# Patient Record
Sex: Female | Born: 1940 | Race: White | Hispanic: No | Marital: Married | Smoking: Former smoker
Health system: Southern US, Community
[De-identification: ages and names within clinical notes are randomized; demographics above are authoritative.]

## PROBLEM LIST (undated history)

## (undated) DIAGNOSIS — Z923 Personal history of irradiation: Secondary | ICD-10-CM

## (undated) DIAGNOSIS — R011 Cardiac murmur, unspecified: Secondary | ICD-10-CM

## (undated) DIAGNOSIS — C3490 Malignant neoplasm of unspecified part of unspecified bronchus or lung: Secondary | ICD-10-CM

## (undated) DIAGNOSIS — C779 Secondary and unspecified malignant neoplasm of lymph node, unspecified: Secondary | ICD-10-CM

## (undated) DIAGNOSIS — J439 Emphysema, unspecified: Secondary | ICD-10-CM

## (undated) HISTORY — DX: Emphysema, unspecified: J43.9

## (undated) HISTORY — PX: PARTIAL HYSTERECTOMY: SHX80

## (undated) HISTORY — DX: Secondary and unspecified malignant neoplasm of lymph node, unspecified: C77.9

## (undated) HISTORY — PX: LYMPHADENECTOMY: SHX15

## (undated) HISTORY — PX: COLONOSCOPY: SHX174

## (undated) HISTORY — DX: Cardiac murmur, unspecified: R01.1

---

## 2002-06-08 DIAGNOSIS — C779 Secondary and unspecified malignant neoplasm of lymph node, unspecified: Secondary | ICD-10-CM

## 2002-06-08 HISTORY — DX: Secondary and unspecified malignant neoplasm of lymph node, unspecified: C77.9

## 2002-08-28 ENCOUNTER — Ambulatory Visit: Admission: RE | Admit: 2002-08-28 | Discharge: 2002-11-26 | Payer: Self-pay | Admitting: *Deleted

## 2008-10-24 ENCOUNTER — Ambulatory Visit (HOSPITAL_COMMUNITY): Admission: RE | Admit: 2008-10-24 | Discharge: 2008-10-24 | Payer: Self-pay | Admitting: Ophthalmology

## 2008-12-06 ENCOUNTER — Ambulatory Visit (HOSPITAL_COMMUNITY): Admission: RE | Admit: 2008-12-06 | Discharge: 2008-12-06 | Payer: Self-pay | Admitting: Ophthalmology

## 2010-09-16 LAB — BASIC METABOLIC PANEL
BUN: 11 mg/dL (ref 6–23)
CO2: 32 mEq/L (ref 19–32)
Chloride: 102 mEq/L (ref 96–112)
Glucose, Bld: 99 mg/dL (ref 70–99)
Potassium: 3.8 mEq/L (ref 3.5–5.1)

## 2013-01-30 ENCOUNTER — Telehealth: Payer: Self-pay | Admitting: Pulmonary Disease

## 2013-01-30 NOTE — Telephone Encounter (Signed)
I spoke with pt daughter. She scheduled appt with MW tomorrow at 12 pm. Aware to arrive at 11:45 and to bring medication bottles at OV. She was giving address. Nothing further needed

## 2013-01-30 NOTE — Telephone Encounter (Signed)
Received call from Dr. Sherryll Burger.  Pt noted to have lung mass on CT chest, and needs pulmonary evaluation.  Will route to triage to call pt to schedule pulmonary consult visit this week >> can be with any provider.  Pt's phone number is [phone].

## 2013-01-31 ENCOUNTER — Encounter: Payer: Self-pay | Admitting: Internal Medicine

## 2013-01-31 ENCOUNTER — Ambulatory Visit (INDEPENDENT_AMBULATORY_CARE_PROVIDER_SITE_OTHER): Payer: Medicare Other | Admitting: Internal Medicine

## 2013-01-31 ENCOUNTER — Institutional Professional Consult (permissible substitution): Payer: Self-pay | Admitting: Internal Medicine

## 2013-01-31 VITALS — BP 100/70 | HR 99 | Temp 98.2°F | Ht 65.0 in | Wt 83.2 lb

## 2013-01-31 DIAGNOSIS — R918 Other nonspecific abnormal finding of lung field: Secondary | ICD-10-CM | POA: Insufficient documentation

## 2013-01-31 DIAGNOSIS — J449 Chronic obstructive pulmonary disease, unspecified: Secondary | ICD-10-CM

## 2013-01-31 DIAGNOSIS — J9610 Chronic respiratory failure, unspecified whether with hypoxia or hypercapnia: Secondary | ICD-10-CM

## 2013-01-31 DIAGNOSIS — R222 Localized swelling, mass and lump, trunk: Secondary | ICD-10-CM

## 2013-01-31 MED ORDER — PREDNISONE (PAK) 10 MG PO TABS: ORAL_TABLET | ORAL | Status: DC

## 2013-01-31 NOTE — Assessment & Plan Note (Signed)
On 02 since 2009 just at hs but Feb 2014 changed to 24/7 02 @ 2 lpm  Adequate control on present rx, reviewed > no change in rx needed, should tolerate fob ok but low threshold to admit post fob for expedient rx

## 2013-01-31 NOTE — Patient Instructions (Addendum)
Stop anaprox  Take hydrocodone  1-2  every 4 hours for pain anywhere or cough  Ok to use the nebulizer up to every 4 h  Prednisone 10 mg take  4 each am x 2 days,   2 each am x 2 days,  1 each am x 2 days and stop   Try prilosec 20mg   Take 30-60 min before first meal of the day and Pepcid 20 mg one bedtime for now  Outpatient registration at [facility]  at noon Thursday the 28th  for bronchoscopy with biopsy and ok to have liquids for breakfast

## 2013-01-31 NOTE — Assessment & Plan Note (Signed)
Moderately severe clinically so rec continue nebs and spiriva plus Prednisone 10 mg take  4 each am x 2 days,   2 each am x 2 days,  1 each am x 2 days and stop

## 2013-01-31 NOTE — Assessment & Plan Note (Signed)
Prev dx of ca ? 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

## 2013-01-31 NOTE — Progress Notes (Signed)
  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 Recurrent  Or new Lung ca   01/31/2013 1st [facility] office visit/ Meredith Alexander cc overall gradually downhill since Feb 2014 initially felt to have pna but never really acutely ill with more of an indolent onset, progressively worse esp x 3 weeks with poor appetite and coughing traces of blood on day of ov on naprosyn. Still able to swallow liquids ok. Hurting all over taking about 3 vicodin a day. Sob with bed to chair not much better with saba/ spiriva and even pred and currently on levaquin  No obvious daytime variabilty or assoc pleurtic cp or chest tightness, subjective wheeze overt sinus or hb symptoms. No unusual exp hx or h/o childhood pna/ asthma or knowledge of premature birth.    Also denies any obvious fluctuation of symptoms with weather or environmental changes or other aggravating or alleviating factors except as outlined above    Review of Systems  Constitutional: Positive for unexpected weight change. Negative for fever and chills.  HENT: Positive for trouble swallowing. Negative for ear pain, nosebleeds, congestion, sore throat, rhinorrhea, sneezing, dental problem, voice change, postnasal drip and sinus pressure.   Eyes: Negative for visual disturbance.  Respiratory: Positive for cough and shortness of breath. Negative for choking.   Cardiovascular: Positive for chest pain. Negative for leg swelling.  Gastrointestinal: Negative for vomiting, abdominal pain and diarrhea.  Genitourinary: Negative for difficulty urinating.  Musculoskeletal: Negative for arthralgias.  Skin: Negative for rash.  Neurological: Negative for tremors, syncope and  headaches.  Hematological: Does not bruise/bleed easily.       Objective:   Physical Exam  Chronically ill elderly wf > stated age, w/c bound on 02  HEENT mild turbinate edema.  Oropharynx no thrush or excess pnd or cobblestoning.  No JVD or cervical adenopathy. Mild accessory muscle hypertrophy. Trachea midline, nl thryroid. Chest was hyperinflated by percussion with diminished breath sounds and moderate increased exp time with sonorous rhonchi bilaterally L > R  Hoover sign positive at mid inspiration. Regular rate and rhythm without murmur gallop or rub or increase P2 or edema.  Abd: no hsm, nl excursion. Ext warm without cyanosis or clubbing.     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

## 2013-02-01 ENCOUNTER — Encounter (HOSPITAL_COMMUNITY): Payer: Self-pay

## 2013-02-02 ENCOUNTER — Ambulatory Visit (HOSPITAL_COMMUNITY)
Admission: RE | Admit: 2013-02-02 | Discharge: 2013-02-02 | Disposition: A | Discharge status: Home / Self Care | Payer: Medicare Other | Source: Ambulatory Visit | Attending: Internal Medicine | Admitting: Internal Medicine

## 2013-02-02 ENCOUNTER — Encounter (HOSPITAL_COMMUNITY)
Admission: RE | Disposition: A | Discharge status: Home / Self Care | Payer: Self-pay | Source: Ambulatory Visit | Attending: Internal Medicine

## 2013-02-02 ENCOUNTER — Telehealth: Payer: Self-pay | Admitting: Internal Medicine

## 2013-02-02 ENCOUNTER — Encounter (HOSPITAL_COMMUNITY): Payer: Self-pay | Admitting: Radiology

## 2013-02-02 DIAGNOSIS — R918 Other nonspecific abnormal finding of lung field: Secondary | ICD-10-CM

## 2013-02-02 DIAGNOSIS — C3430 Malignant neoplasm of lower lobe, unspecified bronchus or lung: Secondary | ICD-10-CM | POA: Insufficient documentation

## 2013-02-02 DIAGNOSIS — Z87891 Personal history of nicotine dependence: Secondary | ICD-10-CM | POA: Insufficient documentation

## 2013-02-02 DIAGNOSIS — R222 Localized swelling, mass and lump, trunk: Secondary | ICD-10-CM

## 2013-02-02 HISTORY — PX: VIDEO BRONCHOSCOPY: SHX5072

## 2013-02-02 SURGERY — VIDEO BRONCHOSCOPY WITHOUT FLUORO
Anesthesia: Moderate Sedation | Laterality: Bilateral

## 2013-02-02 MED ORDER — PHENYLEPHRINE HCL 0.25 % NA SOLN: 1.0000 | Freq: Four times a day (QID) | NASAL | Status: DC | PRN

## 2013-02-02 MED ORDER — MEPERIDINE HCL 100 MG/ML IJ SOLN
INTRAMUSCULAR | Status: AC
  Filled 2013-02-02: qty 2

## 2013-02-02 MED ORDER — LIDOCAINE HCL 2 % EX GEL: Freq: Once | CUTANEOUS | Status: DC

## 2013-02-02 MED ORDER — LIDOCAINE HCL 2 % EX GEL
CUTANEOUS | Status: DC | PRN
  Administered 2013-02-02: 1

## 2013-02-02 MED ORDER — MIDAZOLAM HCL 10 MG/2ML IJ SOLN
INTRAMUSCULAR | Status: AC
  Filled 2013-02-02: qty 4

## 2013-02-02 MED ORDER — MEPERIDINE HCL 25 MG/ML IJ SOLN
INTRAMUSCULAR | Status: DC | PRN
  Administered 2013-02-02: 25 mg via INTRAVENOUS

## 2013-02-02 MED ORDER — LIDOCAINE HCL 1 % IJ SOLN
INTRAMUSCULAR | Status: DC | PRN
  Administered 2013-02-02: 6 mL via RESPIRATORY_TRACT

## 2013-02-02 MED ORDER — EPINEPHRINE HCL 0.1 MG/ML IJ SOSY
PREFILLED_SYRINGE | INTRAMUSCULAR | Status: DC | PRN
  Administered 2013-02-02: 1 mg via ENDOTRACHEOPULMONARY

## 2013-02-02 MED ORDER — ALBUTEROL SULFATE (5 MG/ML) 0.5% IN NEBU
2.5000 mg | INHALATION_SOLUTION | Freq: Once | RESPIRATORY_TRACT | Status: AC
  Administered 2013-02-02: 2.5 mg via RESPIRATORY_TRACT

## 2013-02-02 MED ORDER — OXYCODONE HCL 5 MG PO TABS: ORAL_TABLET | ORAL | Status: DC

## 2013-02-02 MED ORDER — PHENYLEPHRINE HCL 0.25 % NA SOLN
NASAL | Status: DC | PRN
  Administered 2013-02-02: 2 via NASAL

## 2013-02-02 MED ORDER — MIDAZOLAM HCL 10 MG/2ML IJ SOLN
INTRAMUSCULAR | Status: DC | PRN
  Administered 2013-02-02: 2.5 mg via INTRAVENOUS

## 2013-02-02 NOTE — Op Note (Signed)
Bronchoscopy Procedure Note  Date of Operation:  02/02/2013   Pre-op Diagnosis: lung mass  Post-op Diagnosis: lung ca   Surgeon: [name]  Anesthesia: Monitored Local Anesthesia with Sedation  Operation: Video Flexible fiberoptic bronchoscopy, diagnostic   Findings: Endobronchial obstruction with friable mass at level of proximal bronchus intermedius  Specimen: Endobronchial bx RLL  Estimated Blood Loss: min, controlled with epi  Complications: none  Indications and History: See updated H and P same date. The risks, benefits, complications, treatment options and expected outcomes were discussed with the patient.  The possibilities of reaction to medication, pulmonary aspiration, perforation of a viscus, bleeding, failure to diagnose a condition and creating a complication requiring transfusion or operation were discussed with the patient who freely signed the consent.    Description of Procedure: The patient was re-examined in the bronchoscopy suite and the site of surgery properly noted/marked.  The patient was identified  and the procedure verified as Flexible Fiberoptic Bronchoscopy.  A Time Out was held and the above information confirmed.   After the induction of topical nasopharyngeal anesthesia, the patient was positioned  and the bronchoscope was passed through the R naris. The vocal cords were visualized and  1% buffered lidocaine 5 ml was topically placed onto the cords. The cords were  Nl.  The scope was then passed into the trachea.  1% buffered lidocaine given topically. Airways inspected bilaterally to the subsegmental level with the following findings:  Carina and L sided airways are nl R bronchus intermedius with friable lumpy bumpy mass completely obstructing /distoring distal airwasy  Interventions: Epi x one Endobronchial bx x 4 with adequate hemostasis  The Patient was taken to the Endoscopy Recovery area in satisfactory condition.  Attestation: I  performed the procedure.  [name], MD Pulmonary and Critical Care Medicine [facility] Cell [phone] After 5:30 PM or weekends, call [phone]

## 2013-02-02 NOTE — H&P (Signed)
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 Recurrent  Or new Lung ca     01/31/2013 1st [facility] office visit/ Amilliana Hayworth cc overall gradually downhill since Feb 2014 initially felt to have pna but never really acutely ill with more of an indolent onset, progressively worse esp x 3 weeks with poor appetite and coughing traces of blood on day of ov on naprosyn. Still able to swallow liquids ok. Hurting all over taking about 3 vicodin a day. Sob with bed to chair not much better with saba/ spiriva and even pred and currently on levaquin   No obvious daytime variabilty or assoc pleurtic cp or chest tightness, subjective wheeze overt sinus or hb symptoms. No unusual exp hx or h/o childhood pna/ asthma or knowledge of premature birth.     Also denies any obvious fluctuation of symptoms with weather or environmental changes or other aggravating or alleviating factors except as outlined above       Review of Systems  Constitutional: Positive for unexpected weight change. Negative for fever and chills.  HENT: Positive for trouble swallowing. Negative for ear pain, nosebleeds, congestion, sore throat, rhinorrhea, sneezing, dental problem, voice change, postnasal drip and sinus pressure.   Eyes: Negative for visual disturbance.  Respiratory: Positive for cough and shortness of breath. Negative for choking.   Cardiovascular: Positive for chest pain. Negative for leg swelling.  Gastrointestinal: Negative for vomiting, abdominal pain and diarrhea.  Genitourinary: Negative for difficulty urinating.  Musculoskeletal: Negative for arthralgias.  Skin: Negative for rash.  Neurological: Negative  for tremors, syncope and headaches.  Hematological: Does not bruise/bleed easily.          Objective:     Physical Exam   Chronically ill elderly wf > stated age, w/c bound on 02   HEENT mild turbinate edema.  Oropharynx no thrush or excess pnd or cobblestoning.  No JVD or cervical adenopathy. Mild accessory muscle hypertrophy. Trachea midline, nl thryroid. Chest was hyperinflated by percussion with diminished breath sounds and moderate increased exp time with sonorous rhonchi bilaterally L > R  Hoover sign positive at mid inspiration. Regular rate and rhythm without murmur gallop or rub or increase P2 or edema.  Abd: no hsm, nl excursion. Ext warm without cyanosis or clubbing.       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           Chronic respiratory failure  On 02 since 2009 just at hs but Feb 2014 changed to 24/7 02 @ 2 lpm   Adequate control on present rx, reviewed > no change in rx needed, should tolerate fob ok but low threshold to admit post fob for expedient rx           COPD pft's pending      Moderately severe clinically so rec continue nebs and spiriva plus Prednisone 10 mg take  4 each am x 2 days,   2 each am x 2 days,  1 each am x 2 days and stop                    Medications Ordered This Encounter      Disp Refills Start End    predniSONE (STERAPRED UNI-PAK) 10 MG tablet 14 tablet 0 01/31/2013      Prednisone 10 mg take  4 each am x 2 days,   2 each am x 2 days,  1 each am x2days  and stop       Discontinued Medications      naproxen sodium (ANAPROX) 220 MG tablet   Due to bleeding  potential          Patient Instructions    Stop anaprox   Take hydrocodone  1-2  every 4 hours for pain anywhere or cough   Ok to use the nebulizer up to every 4 h   Prednisone 10 mg take  4 each am x 2 days,   2 each am x 2 days,  1 each am x 2 days and stop    Try prilosec 20mg   Take 30-60 min before first meal of the day and Pepcid 20 mg one bedtime for now   Outpatient registration at [facility]  at noon Thursday the 28th  for bronchoscopy with biopsy and ok to have liquids for breakfast     02/02/2013  Day of fob No change in hx or exam     [name], MD Pulmonary and Critical Care Medicine [facility] Cell [phone] After 5:30 PM or weekends, call [phone]

## 2013-02-02 NOTE — Progress Notes (Signed)
Bronch w/ video intervention performed, bronchial biopsy intervention performed.

## 2013-02-02 NOTE — Telephone Encounter (Signed)
Discussed with daughter - using vicodin every 4 hours so rec change to oxyir 5 mg 1-3 up to every 4 hours to eliminate concern for tylenol toxicity

## 2013-02-03 ENCOUNTER — Telehealth: Payer: Self-pay | Admitting: Internal Medicine

## 2013-02-03 ENCOUNTER — Encounter (HOSPITAL_COMMUNITY): Payer: Self-pay | Admitting: Internal Medicine

## 2013-02-03 ENCOUNTER — Encounter: Payer: Self-pay | Admitting: Internal Medicine

## 2013-02-03 DIAGNOSIS — R918 Other nonspecific abnormal finding of lung field: Secondary | ICD-10-CM

## 2013-02-03 MED ORDER — OXYCODONE HCL 5 MG PO TABS: ORAL_TABLET | ORAL | Status: DC

## 2013-02-03 NOTE — Telephone Encounter (Signed)
LMTCB x1 w/ co-worker

## 2013-02-03 NOTE — Telephone Encounter (Signed)
Discussed dx with daughter > referred to RT to start ASAP

## 2013-02-03 NOTE — Telephone Encounter (Signed)
Daughter called and stated she will pick up the oxycodone RX instead. This has been printed off and will placed upfront once signed /

## 2013-02-03 NOTE — Telephone Encounter (Signed)
Pt returned triage's call. Ok to leave message on her cell phone, [phone], if unable to reach at other left number.   Meredith Alexander

## 2013-02-03 NOTE — Telephone Encounter (Signed)
Pt daughter calling back wants to know about picking up a presc [phone] or   [phone] between 1 and 2

## 2013-02-03 NOTE — Telephone Encounter (Signed)
I spoke with pt daughter. She stated the oxycodone 5 mg has to be brought in and we can't fax this in. She wants to know since she lived in [place] if Dr. Sherene Sires can call pt PCP to see if they can write this RX and they pick this up from PCP since they are closer. Also percocet and norco original RX has tobe brought into the pharmacy as well d/t the class changed. If not if there something else than can be called in for pt. She has enough pain medication to last until Monday. Please advise MW thanks  No Known Allergies

## 2013-02-03 NOTE — Telephone Encounter (Signed)
Cassell Clement, pt's daughter, is needing this Rx written ASAP.  Pt's daughter is wanting to p/u before leaving work & returning to [place].  [phone] or [phone].  Antionette Fairy

## 2013-02-03 NOTE — Telephone Encounter (Signed)
Dr. Sherene Sires please advise regarding RX thanks

## 2013-02-07 ENCOUNTER — Telehealth: Payer: Self-pay | Admitting: Internal Medicine

## 2013-02-07 ENCOUNTER — Encounter: Payer: Self-pay | Admitting: Radiation Oncology

## 2013-02-07 DIAGNOSIS — J9610 Chronic respiratory failure, unspecified whether with hypoxia or hypercapnia: Secondary | ICD-10-CM

## 2013-02-07 DIAGNOSIS — R918 Other nonspecific abnormal finding of lung field: Secondary | ICD-10-CM

## 2013-02-07 NOTE — Telephone Encounter (Signed)
Pt daughter advised and referral placed. Carron Curie, CMA

## 2013-02-07 NOTE — Telephone Encounter (Signed)
lmomtcb x1 for daughter 

## 2013-02-07 NOTE — Telephone Encounter (Signed)
This is likely due to too much valium or oxyir or both, they need to be adjusted downward but not stopped.   This condition is inoperable and uncurable so ok to focus on what is best for her needs/ comfort so ok to refer to [facility] if that works better for her.

## 2013-02-07 NOTE — Telephone Encounter (Signed)
No appt in [facility] until 02/24/13 daughter has decided to come here 02/08/13 to see dr moody will change later if needed Tobe Sos

## 2013-02-07 NOTE — Progress Notes (Signed)
Thoracic Location of Tumor / Histology: Right Lower Lung   Patient presented to Dr. Sherene Sires with symptoms of: poor appetite and coughing trace amounts of blood.  Biopsies of endobrachial area revealed: Endobronchial biopsy, RLL - SQUAMOUS CELL CARCINOMA  Tobacco/Marijuana/Snuff/ETOH use: quit smoking 05/2012 - smoked 1 PPD for 55 years  Past/Anticipated interventions by cardiothoracic surgery, if any: none  Past/Anticipated interventions by medical oncology, if any: unknown  Signs/Symptoms  Weight changes, if any: 20 lb total weight loss since March - 15 lbs in last month  Respiratory complaints, if any: On 2l of oxygen, coughing frequently and occasional coughing up blood  Hemoptysis, if any: yes  Pain issues, if any:  Chest pain that can go up to a 10/10.  Taking oxycodone currently.  SAFETY ISSUES:  Prior radiation? Yes - 09/06/2002-10/31/2002 to left neck  Pacemaker/ICD? no  Possible current pregnancy? no  Is the patient on methotrexate? no  Current Complaints / other details:  Had not had a bowel movement in a week and a half.   Her appetite is poor.  Per her daugher, she has been confused since she was switched to oxycodone for pain.  She reduced her dose to 1 /12 tablets every 4 hours with some improvement in the confusion.  Her family also would like to know about how to get her a walker for at home.

## 2013-02-07 NOTE — Telephone Encounter (Signed)
Pt's daughter returned call & can be reached at [phone].  Antionette Fairy

## 2013-02-07 NOTE — Telephone Encounter (Signed)
Chronic respiratory failure - [name], MD at 01/31/2013  4:31 PM    Status: Written Related Problem: Chronic respiratory failure           On 02 since 2009 just at hs but Feb 2014 changed to 24/7 02 @ 2 lpm     ---  I spoke with daughter and she is wanting to make sure she is to stay at 2 liters O2. She is also wanting to know if they could go to the cancer center in [place]. Per daughter this is going to wear on pt coming back and forth to [place]. Pt also seems a little confused and not able to follow your conversation. Pt eyes are glassy. As the day goes on she is little more alert and does not act like this. She is wanting to know if MW feels like she needs to go to ED. Please advise MW thanks

## 2013-02-08 ENCOUNTER — Encounter: Payer: Self-pay | Admitting: Radiation Oncology

## 2013-02-08 ENCOUNTER — Ambulatory Visit
Admission: RE | Admit: 2013-02-08 | Discharge: 2013-02-08 | Disposition: A | Discharge status: Home / Self Care | Payer: Medicare Other | Source: Ambulatory Visit | Attending: Radiation Oncology | Admitting: Radiation Oncology

## 2013-02-08 VITALS — BP 117/78 | HR 112 | Temp 98.4°F | Ht 65.0 in | Wt 80.7 lb

## 2013-02-08 DIAGNOSIS — Z87891 Personal history of nicotine dependence: Secondary | ICD-10-CM | POA: Insufficient documentation

## 2013-02-08 DIAGNOSIS — R0602 Shortness of breath: Secondary | ICD-10-CM | POA: Insufficient documentation

## 2013-02-08 DIAGNOSIS — J438 Other emphysema: Secondary | ICD-10-CM | POA: Insufficient documentation

## 2013-02-08 DIAGNOSIS — C3401 Malignant neoplasm of right main bronchus: Secondary | ICD-10-CM

## 2013-02-08 DIAGNOSIS — R918 Other nonspecific abnormal finding of lung field: Secondary | ICD-10-CM

## 2013-02-08 DIAGNOSIS — R630 Anorexia: Secondary | ICD-10-CM | POA: Insufficient documentation

## 2013-02-08 DIAGNOSIS — Z923 Personal history of irradiation: Secondary | ICD-10-CM | POA: Insufficient documentation

## 2013-02-08 DIAGNOSIS — C3430 Malignant neoplasm of lower lobe, unspecified bronchus or lung: Secondary | ICD-10-CM | POA: Insufficient documentation

## 2013-02-08 HISTORY — DX: Personal history of irradiation: Z92.3

## 2013-02-08 HISTORY — DX: Malignant neoplasm of unspecified part of unspecified bronchus or lung: C34.90

## 2013-02-08 MED ORDER — FENTANYL 12 MCG/HR TD PT72: 1.0000 | MEDICATED_PATCH | TRANSDERMAL | Status: DC

## 2013-02-08 NOTE — Progress Notes (Signed)
Please see the Nurse Progress Note in the MD Initial Consult Encounter for this patient. 

## 2013-02-08 NOTE — Patient Instructions (Signed)
For constipation: Continue taking stool softener. Began using MiraLax daily until you begin having bowel movements. Continue to take stool softener as long as you are on strong pain medication.  For pain: Began taking Fentanyl pain patch. Continue to take your current pain medication as needed according to current prescription. The patch should help his pain medicine work better and decreased pain overall.

## 2013-02-10 DIAGNOSIS — C3400 Malignant neoplasm of unspecified main bronchus: Secondary | ICD-10-CM | POA: Insufficient documentation

## 2013-02-10 NOTE — Progress Notes (Signed)
Radiation Oncology         [phone] ________________________________  Name: Meredith Alexander MRN: [id number]  Date: 02/08/2013  DOB: 1941-03-16  NW:GNFA,OZHYQM, MD  [name], MD   Lance Bosch, MD  REFERRING PHYSICIAN: [name], MD   DIAGNOSIS: The encounter diagnosis was Lung mass.   HISTORY OF PRESENT ILLNESS::Meredith Alexander is a 72 y.o. female who is seen for an initial consultation visit. The patient is seen today regarding a new diagnosis of lung cancer. 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 She reportedly received treatment in [place] as well as at [facility] and the total dose delivered to the left neck was 67 gray which was given by Dr. Dorna Bloom. I have discussed his prior treatment with medical records in [place] and they are in the process of obtaining additional specific information regarding her prior treatment.  The patient has been evaluated by pulmonary for some changes in breathing as well as a lung mass. She indicates that she has had a history of breathing difficulties and has been on supplemental oxygen for a number of years. She describes some possible worsening shortness of breath. The patient feels that her shortness of breath has increased over the last 3-4 months. She has some occasional chest pain which she describes centrally and she also has had some occasional hemoptysis. No significant increase in this. She also notes a poor appetite and she feels that she is lost approximately 20 pounds over the last several months. The patient had a CT scan of the chest completed which showed significant central disease with an obstructing subcarinal mass in and around the right hilum. There was occlusion of the right mainstem  bronchus and mass effect on the left mainstem bronchus with narrowing as well. A small right pleural effusion is present. Secondary lung changes were present with a 2 round right lower lobe and right middle lobe. Additional workup included a nuclear medicine test which did show some focal uptake within the mid shaft of the right femur. An x-ray of the right femur was obtained which did not reveal any focal lesion or fracture in this area of concern. X-rays of the spine have also been completed and did not reveal any clear metastatic disease.  The patient proceeded to undergo a bronchoscopy on 02/02/2013. A friable mass was seen in the right rhonchus intermedius distorting the distal airway. The carina and left sided airway is  were felt to be normal. Biopsies revealed squamous cell carcinoma.   PREVIOUS RADIATION THERAPY: Yes : Prior radiotherapy to the left neck as noted above    PAST MEDICAL HISTORY:  has a past medical history of Heart murmur; Emphysema; Lymph node cancer (2004); History of radiation therapy (09/06/2002-10/31/2002); and Lung cancer.     PAST SURGICAL HISTORY: Past Surgical History  Procedure Laterality Date  . Partial hysterectomy    . Lymphadenectomy    . Video bronchoscopy Bilateral 02/02/2013    Procedure: VIDEO BRONCHOSCOPY WITHOUT FLUORO;  Surgeon: [name], MD;  Location: [facility];  Service: Cardiopulmonary;  Laterality: Bilateral;  . Colonoscopy       FAMILY HISTORY: family history includes Emphysema in her father and sister; Heart disease in her mother; Leukemia in her father.   SOCIAL HISTORY:  reports that she quit smoking about 9 months ago. Her smoking use included Cigarettes. She has a 55 pack-year smoking history. She has never used smokeless tobacco. She reports that she does not drink alcohol or use illicit drugs.   ALLERGIES: Review of patient's allergies indicates no known allergies.   MEDICATIONS:  Current Outpatient Prescriptions    Medication Sig Dispense Refill  . albuterol (PROVENTIL HFA) 108 (90 BASE) MCG/ACT inhaler Inhale 2 puffs into the lungs every 6 (six) hours as needed for wheezing or shortness of breath.      Marland Kitchen albuterol (PROVENTIL) (2.5 MG/3ML) 0.083% nebulizer solution Take 2.5 mg by nebulization 4 (four) times daily.      . diazepam (VALIUM) 5 MG tablet Take 5 mg by mouth 3 (three) times daily.      . famotidine (PEPCID) 20 MG tablet Take 20 mg by mouth at bedtime as needed for heartburn.      . megestrol (MEGACE) 20 MG tablet Take 40 mg by mouth 2 (two) times daily. Takes 2 tablets twice a day      . methocarbamol (ROBAXIN) 500 MG tablet Take 500 mg by mouth at bedtime.      Marland Kitchen omeprazole (PRILOSEC) 10 MG capsule Take 10 mg by mouth daily.      Marland Kitchen oxyCODONE (OXY IR/ROXICODONE) 5 MG immediate release tablet Up to 1-3 every 4 hours as needed  90 tablet  0  . tiotropium (SPIRIVA) 18 MCG inhalation capsule Place 18 mcg into inhaler and inhale daily.      . fentaNYL (DURAGESIC) 12 MCG/HR Place 1 patch (12.5 mcg total) onto the skin every 3 (three) days.  10 patch  0   No current facility-administered medications for this encounter.     REVIEW OF SYSTEMS:  A 15 point review of systems is documented in the electronic medical record. This was obtained by the nursing staff. However, I reviewed this with the patient to discuss relevant findings and make appropriate changes.  Pertinent items are noted in HPI.    PHYSICAL EXAM:  height is 5\' 5"  (1.651 m) and weight is 80 lb 11.2 oz (36.605 kg). Her temperature is 98.4 F (36.9 C). Her blood pressure is 117/78 and her pulse is 112. Her oxygen saturation is 100%.   General: in no acute distress; sitting in a wheelchair, somewhat ill appearing; on supplemental oxygen HEENT: Normocephalic, atraumatic; oral cavity clear Neck: Supple without any lymphadenopathy Cardiovascular: Regular rate and rhythm Respiratory: absent breath sounds in right base; otherwise clear GI:  Soft, nontender, normal bowel sounds Extremities: No edema present Neuro: No focal deficits    LABORATORY DATA:  Lab Results  Component Value Date   HGB 15.5* 10/16/2008   HCT 45.3 10/16/2008   Lab Results  Component Value Date   NA 140 10/16/2008   K 3.8 10/16/2008   CL 102 10/16/2008   CO2 32 10/16/2008   No results found for this basename: ALT, AST, GGT, ALKPHOS, BILITOT      RADIOGRAPHY: No results found.     IMPRESSION: The patient has significant disease within the chest centrally with consolidation of the inferior aspect of the right lung. She does have a history of malignancy which corresponded to squamous cell carcinoma of the head neck region which represented an unknown primary. The patient's current biopsy revealed squamous cell carcinoma from the endobronchial biopsy. It is unclear to me at this time whether this represents a new diagnosis of lung cancer versus recurrent disease. In any  event, the patient is at risk of significant worsening of symptoms with further progression locally and I believe that the patient is a candidate at least for a short course of palliative radiotherapy. Additional staging studies can be completed and a more definitive, long-term plan can be formulated if appropriate.   I discussed with the patient the above plan to begin a course of palliative radiotherapy in the near future. Based on seeing her today, I would like to begin as soon as possible. The patient lives in [place] and I believe that this would be appropriate for her to be treated today her if she so wishes. The patient is interested in this and therefore we will coordinate this and get her scheduled for a simulation as soon as possible. I discussed with the patient what would be entailed in a typical course of treatment to this area. We discussed the possible side effects and risks and all of her questions were answered.   PLAN:  -Simulation will be scheduled in the near future in [place]: My  anticipation is that we will initially plan for a short 2-3 week course of palliative radiotherapy to the central chest to try to protect the airways and hopefully open the collapsed lung. I think additional plans can be made after PET scan and brain MRI scan are completed, with the major issues being whether a longer more definitive course of treatment would be a possible consideration, and also whether further workup needs to be done to try to clarify whether this represents a new lung cancer versus recurrence of prior cancer. This may affect medical oncology recommendations more than altering her course of radiation treatment.  -A PET scan and brain MRI scan for further staging studies.   -Medical oncology consultation which will be scheduled in [facility]. The patient looks ill today and I am not sure what systemic treatment options will be available for her.     I spent 70 minutes minutes face to face with the patient and more than 50% of that time was spent in counseling and/or coordination of care.    ________________________________   Radene Gunning, MD, PhD

## 2013-02-11 ENCOUNTER — Encounter (HOSPITAL_COMMUNITY): Payer: Self-pay | Admitting: Emergency Medicine

## 2013-02-11 ENCOUNTER — Inpatient Hospital Stay (HOSPITAL_COMMUNITY)
Admission: EM | Admit: 2013-02-11 | Discharge: 2013-02-17 | DRG: 180 | Disposition: A | Discharge status: Hospice | Payer: Medicare Other | Attending: Internal Medicine | Admitting: Internal Medicine

## 2013-02-11 ENCOUNTER — Emergency Department (HOSPITAL_COMMUNITY): Payer: Medicare Other

## 2013-02-11 DIAGNOSIS — C3491 Malignant neoplasm of unspecified part of right bronchus or lung: Secondary | ICD-10-CM

## 2013-02-11 DIAGNOSIS — I2510 Atherosclerotic heart disease of native coronary artery without angina pectoris: Secondary | ICD-10-CM | POA: Diagnosis present

## 2013-02-11 DIAGNOSIS — K5900 Constipation, unspecified: Secondary | ICD-10-CM | POA: Diagnosis not present

## 2013-02-11 DIAGNOSIS — J9610 Chronic respiratory failure, unspecified whether with hypoxia or hypercapnia: Secondary | ICD-10-CM

## 2013-02-11 DIAGNOSIS — J449 Chronic obstructive pulmonary disease, unspecified: Secondary | ICD-10-CM

## 2013-02-11 DIAGNOSIS — Z515 Encounter for palliative care: Secondary | ICD-10-CM

## 2013-02-11 DIAGNOSIS — M549 Dorsalgia, unspecified: Secondary | ICD-10-CM

## 2013-02-11 DIAGNOSIS — Z87891 Personal history of nicotine dependence: Secondary | ICD-10-CM

## 2013-02-11 DIAGNOSIS — I739 Peripheral vascular disease, unspecified: Secondary | ICD-10-CM | POA: Diagnosis present

## 2013-02-11 DIAGNOSIS — R627 Adult failure to thrive: Secondary | ICD-10-CM

## 2013-02-11 DIAGNOSIS — R031 Nonspecific low blood-pressure reading: Secondary | ICD-10-CM | POA: Diagnosis not present

## 2013-02-11 DIAGNOSIS — R918 Other nonspecific abnormal finding of lung field: Secondary | ICD-10-CM

## 2013-02-11 DIAGNOSIS — C3490 Malignant neoplasm of unspecified part of unspecified bronchus or lung: Principal | ICD-10-CM

## 2013-02-11 DIAGNOSIS — Z923 Personal history of irradiation: Secondary | ICD-10-CM

## 2013-02-11 DIAGNOSIS — J441 Chronic obstructive pulmonary disease with (acute) exacerbation: Secondary | ICD-10-CM | POA: Diagnosis present

## 2013-02-11 DIAGNOSIS — C3401 Malignant neoplasm of right main bronchus: Secondary | ICD-10-CM

## 2013-02-11 DIAGNOSIS — M8448XA Pathological fracture, other site, initial encounter for fracture: Secondary | ICD-10-CM | POA: Diagnosis present

## 2013-02-11 DIAGNOSIS — I10 Essential (primary) hypertension: Secondary | ICD-10-CM | POA: Diagnosis present

## 2013-02-11 DIAGNOSIS — E43 Unspecified severe protein-calorie malnutrition: Secondary | ICD-10-CM

## 2013-02-11 DIAGNOSIS — Z79899 Other long term (current) drug therapy: Secondary | ICD-10-CM

## 2013-02-11 DIAGNOSIS — Z9981 Dependence on supplemental oxygen: Secondary | ICD-10-CM

## 2013-02-11 DIAGNOSIS — J9819 Other pulmonary collapse: Secondary | ICD-10-CM | POA: Diagnosis present

## 2013-02-11 DIAGNOSIS — J9620 Acute and chronic respiratory failure, unspecified whether with hypoxia or hypercapnia: Secondary | ICD-10-CM

## 2013-02-11 DIAGNOSIS — Z66 Do not resuscitate: Secondary | ICD-10-CM | POA: Diagnosis present

## 2013-02-11 DIAGNOSIS — C3400 Malignant neoplasm of unspecified main bronchus: Secondary | ICD-10-CM

## 2013-02-11 DIAGNOSIS — J189 Pneumonia, unspecified organism: Secondary | ICD-10-CM | POA: Diagnosis present

## 2013-02-11 LAB — COMPREHENSIVE METABOLIC PANEL
BUN: 16 mg/dL (ref 6–23)
Calcium: 10.4 mg/dL (ref 8.4–10.5)
GFR calc Af Amer: >90 mL/min (ref >90)
Glucose, Bld: 134 mg/dL — ABNORMAL HIGH (ref 70–99)
Sodium: 139 mEq/L (ref 135–145)
Total Protein: 7.6 g/dL (ref 6.0–8.3)

## 2013-02-11 LAB — CBC WITH DIFFERENTIAL/PLATELET
Basophils Relative: 0 % (ref 0–1)
Eosinophils Absolute: 0 10*3/uL (ref 0.0–0.7)
Eosinophils Relative: 0 % (ref 0–5)
Lymphs Abs: 0.6 10*3/uL — ABNORMAL LOW (ref 0.7–4.0)
MCH: 27.4 pg (ref 26.0–34.0)
MCHC: 31.7 g/dL (ref 30.0–36.0)
MCV: 86.4 fL (ref 78.0–100.0)
Monocytes Relative: 5 % (ref 3–12)
Platelets: 487 10*3/uL — ABNORMAL HIGH (ref 150–400)
RBC: 4.42 MIL/uL (ref 3.87–5.11)

## 2013-02-11 LAB — POCT I-STAT 3, ART BLOOD GAS (G3+)
Bicarbonate: 30.8 mEq/L — ABNORMAL HIGH (ref 20.0–24.0)
pH, Arterial: 7.415 (ref 7.350–7.450)
pO2, Arterial: 100 mmHg (ref 80.0–100.0)

## 2013-02-11 MED ORDER — GUAIFENESIN ER 600 MG PO TB12
600.0000 mg | ORAL_TABLET | Freq: Two times a day (BID) | ORAL | Status: DC
  Administered 2013-02-11 – 2013-02-12 (×3): 600 mg via ORAL
  Filled 2013-02-11 (×5): qty 1

## 2013-02-11 MED ORDER — FENTANYL 12 MCG/HR TD PT72
12.5000 ug | MEDICATED_PATCH | TRANSDERMAL | Status: DC
  Administered 2013-02-11 – 2013-02-14 (×2): 12.5 ug via TRANSDERMAL
  Filled 2013-02-11 (×2): qty 1

## 2013-02-11 MED ORDER — SODIUM CHLORIDE 0.9 % IV SOLN: 250.0000 mL | INTRAVENOUS | Status: DC | PRN

## 2013-02-11 MED ORDER — OXYCODONE HCL 5 MG PO TABS
5.0000 mg | ORAL_TABLET | ORAL | Status: DC | PRN
  Administered 2013-02-11 – 2013-02-14 (×11): 5 mg via ORAL
  Filled 2013-02-11 (×13): qty 1

## 2013-02-11 MED ORDER — DIAZEPAM 2 MG PO TABS
2.0000 mg | ORAL_TABLET | Freq: Four times a day (QID) | ORAL | Status: DC | PRN
  Administered 2013-02-11: 2 mg via ORAL
  Filled 2013-02-11: qty 1

## 2013-02-11 MED ORDER — METHYLPREDNISOLONE SODIUM SUCC 125 MG IJ SOLR
125.0000 mg | Freq: Once | INTRAMUSCULAR | Status: AC
  Administered 2013-02-11: 125 mg via INTRAVENOUS
  Filled 2013-02-11: qty 2

## 2013-02-11 MED ORDER — METHYLPREDNISOLONE SODIUM SUCC 125 MG IJ SOLR
80.0000 mg | Freq: Two times a day (BID) | INTRAMUSCULAR | Status: DC
  Administered 2013-02-11 – 2013-02-13 (×4): 80 mg via INTRAVENOUS
  Filled 2013-02-11 (×3): qty 1.28
  Filled 2013-02-11: qty 2
  Filled 2013-02-11 (×3): qty 1.28

## 2013-02-11 MED ORDER — VANCOMYCIN HCL IN DEXTROSE 750-5 MG/150ML-% IV SOLN
750.0000 mg | INTRAVENOUS | Status: DC
  Administered 2013-02-11 – 2013-02-13 (×3): 750 mg via INTRAVENOUS
  Filled 2013-02-11 (×4): qty 150

## 2013-02-11 MED ORDER — ONDANSETRON HCL 4 MG PO TABS: 4.0000 mg | ORAL_TABLET | Freq: Four times a day (QID) | ORAL | Status: DC | PRN

## 2013-02-11 MED ORDER — IPRATROPIUM BROMIDE 0.02 % IN SOLN
0.5000 mg | Freq: Four times a day (QID) | RESPIRATORY_TRACT | Status: DC
  Administered 2013-02-11 – 2013-02-17 (×22): 0.5 mg via RESPIRATORY_TRACT
  Filled 2013-02-11 (×23): qty 2.5

## 2013-02-11 MED ORDER — SODIUM CHLORIDE 0.9 % IV BOLUS (SEPSIS)
1000.0000 mL | Freq: Once | INTRAVENOUS | Status: AC
  Administered 2013-02-11: 1000 mL via INTRAVENOUS

## 2013-02-11 MED ORDER — ONDANSETRON HCL 4 MG/2ML IJ SOLN: 4.0000 mg | Freq: Four times a day (QID) | INTRAMUSCULAR | Status: DC | PRN

## 2013-02-11 MED ORDER — ALBUTEROL SULFATE (5 MG/ML) 0.5% IN NEBU
2.5000 mg | INHALATION_SOLUTION | Freq: Four times a day (QID) | RESPIRATORY_TRACT | Status: DC
  Administered 2013-02-11 – 2013-02-17 (×22): 2.5 mg via RESPIRATORY_TRACT
  Filled 2013-02-11 (×23): qty 0.5

## 2013-02-11 MED ORDER — PIPERACILLIN-TAZOBACTAM 3.375 G IVPB
3.3750 g | Freq: Once | INTRAVENOUS | Status: AC
  Administered 2013-02-11: 3.375 g via INTRAVENOUS
  Filled 2013-02-11: qty 50

## 2013-02-11 MED ORDER — PANTOPRAZOLE SODIUM 40 MG PO TBEC
40.0000 mg | DELAYED_RELEASE_TABLET | Freq: Two times a day (BID) | ORAL | Status: DC
  Administered 2013-02-12 (×3): 40 mg via ORAL
  Filled 2013-02-11 (×3): qty 1

## 2013-02-11 MED ORDER — ACETAMINOPHEN 325 MG PO TABS
650.0000 mg | ORAL_TABLET | Freq: Four times a day (QID) | ORAL | Status: DC | PRN
  Administered 2013-02-12 – 2013-02-16 (×5): 650 mg via ORAL
  Filled 2013-02-11 (×5): qty 2

## 2013-02-11 MED ORDER — TRAZODONE 25 MG HALF TABLET
25.0000 mg | ORAL_TABLET | Freq: Every evening | ORAL | Status: DC | PRN
  Filled 2013-02-11: qty 1

## 2013-02-11 MED ORDER — PIPERACILLIN-TAZOBACTAM 3.375 G IVPB
3.3750 g | Freq: Three times a day (TID) | INTRAVENOUS | Status: DC
  Administered 2013-02-12 – 2013-02-15 (×11): 3.375 g via INTRAVENOUS
  Filled 2013-02-11 (×15): qty 50

## 2013-02-11 MED ORDER — ALBUTEROL SULFATE (5 MG/ML) 0.5% IN NEBU
2.5000 mg | INHALATION_SOLUTION | Freq: Once | RESPIRATORY_TRACT | Status: AC
  Administered 2013-02-11: 2.5 mg via RESPIRATORY_TRACT
  Filled 2013-02-11: qty 0.5

## 2013-02-11 MED ORDER — VANCOMYCIN HCL IN DEXTROSE 1-5 GM/200ML-% IV SOLN
1000.0000 mg | Freq: Once | INTRAVENOUS | Status: AC
  Administered 2013-02-11: 1000 mg via INTRAVENOUS
  Filled 2013-02-11: qty 200

## 2013-02-11 MED ORDER — ACETAMINOPHEN 650 MG RE SUPP: 650.0000 mg | Freq: Four times a day (QID) | RECTAL | Status: DC | PRN

## 2013-02-11 NOTE — ED Notes (Signed)
Meal tray ordered 

## 2013-02-11 NOTE — Progress Notes (Signed)
ANTIBIOTIC CONSULT NOTE - INITIAL  Pharmacy Consult for Vancomycin Indication: rule out pneumonia  No Known Allergies  Patient Measurements: Height: 5\' 5"  (165.1 cm) Weight: 80 lb (36.288 kg) IBW/kg (Calculated) : 57  Total Body Weight: 36kg  Vital Signs: Temp: 99.3 F (37.4 C) (09/06 1331) Temp src: Rectal (09/06 1331) BP: 136/76 mmHg (09/06 1752) Pulse Rate: 95 (09/06 1752) Intake/Output from previous day:   Intake/Output from this shift:    Labs:  Recent Labs  02/11/13 1311  WBC 22.7*  HGB 12.1  PLT 487*  CREATININE 0.52   Estimated Creatinine Clearance: 37 ml/min (by C-G formula based on Cr of 0.52). No results found for this basename: VANCOTROUGH, VANCOPEAK, VANCORANDOM, GENTTROUGH, GENTPEAK, GENTRANDOM, TOBRATROUGH, TOBRAPEAK, TOBRARND, AMIKACINPEAK, AMIKACINTROU, AMIKACIN,  in the last 72 hours   Microbiology: No results found for this or any previous visit (from the past 720 hour(s)).  Medical History: Past Medical History  Diagnosis Date  . Heart murmur   . Emphysema   . Lymph node cancer 2004  . History of radiation therapy 09/06/2002-10/31/2002    left neck incision, bilateral supraclavicular region, lower neck and upper neck  . Lung cancer     RLL     Assessment: 31 yof with recent diagnosis of lung cancer.  She has had progressive SOB, WBC 22, afebrile, Cr 0.5,  Cxr with possible R pna.  Will begin broad spectrum ABX - Vancomycin and Zosyn.    Goal of Therapy:  Vancomycin trough level 15-20 mcg/ml  Plan:  Vancomycin 750mg  q24 Zosyn 3.375GM q8 EI   Leota Sauers Pharm.D. CPP, BCPS Clinical Pharmacist [phone] 02/11/2013 6:19 PM

## 2013-02-11 NOTE — ED Notes (Signed)
Meredith Alexander is a 72 y.o. female Chief Complaint  Patient presents with  . Shortness of Breath   Arrives via [facility] with complaints of sob. Per family patient has completed breathing treatments at home without improved and became more sob. During transport to ED patient was placed on CPAP and desaturated to low 80's. Patient with hx of Lung CA, COPD, and collapsed R lung.   Arrives with CPAP in place. Per EMS attempted to give nitroglycerin pta and patient spit it out each time.   20 g to L AC  20 to R forearm placed during triage. Respiratory to beside for triage and placed on bipap.

## 2013-02-11 NOTE — H&P (Signed)
Patient name: Meredith Alexander Medical record number: [id number] Date of birth: 1941-05-13 Age: 72 y.o. Gender: female PCP: Kirstie Peri, MD  Date: 02/11/2013 Reason for Consult:  Recently dx lung cancer & FTT Referring Physician:  EDP- DrRay  Brief history:  Pt is a 72 y/o WF, exsmoker quit 12/13, pt of MW w/ recently dx Squamous cell lung cancer in bronchus intermedius w/ RLL/RML atelectasis.  Referred to [facility] for XRT & this is in the process of being arranged in [facility] along w/ Oncology consult.  She has become progressively weaker, more dyspneic, cough w/ streaky hemoptysis, FTT at home & family brought her to the ER 02/11/13 for eval => progressive RLL atelectasis, prob post obstructive pneumonia, WBC=22K, etc.  Pt requests and family (son & daughter) confirms request for DNR/ NCB/ DNI but they request oxygen, resp treatment, antibiotics, etc in addition to comfort care.  They understand that her prognosis is poor...  Lines/tubes  Culture data/sepsis markers 9/6 Sput requested for C&S>>   Antibiotics 9/6 Zosyn>>  9/6 Vanco>>  Best practice 9/6 no DVT prophylaxis ordered due to hemoptysis hx 9/6 Protonix40 Bid  Protocols/consults  Events/studies   HPI:  72 y/o WF referred to DrWert 01/31/13 w/ 65mo hx going downhill, thought to have pneumonia by LMD but indolent progressive course worse x3mo w/ poor appetite, 20 lb wt loss, cough w/ hemoptysis, incr SOB w/ ADLs, & not much better despite SABA/ Spiriva/ Pred/ Levaquin...  Hx underlying severe O2 dependent COPD and hx H&N squamous cell ca dx 2004 w/ lymph node bxleft side of neck & treated w/ chemo + XRT at that time (no known recurrence)...    Chest CT in [facility] 01/27/13 showed obstructing/ encasing subcarinal mass (5-6cm) extending to the right hilum, right effusion, severe emphysema, coronary calcif, infrarenal AAA measuring 4cm, T4 compression, osteopenia, degen disc dis, etc...     Bronchoscopy w/ Bx 02/02/13 by DrWert showed endobronch  obstruction from a friable tumor mass in the bronchus intermedius; Path= Squamous Cell Ca    CXR 9/6 shows right sided vol loss, atelec, effusion, likely from obstructing right hilar adenop & mass   Past Medical History  Diagnosis Date  . Heart murmur   . Emphysema   . Lymph node cancer 2004  . History of radiation therapy 09/06/2002-10/31/2002    left neck incision, bilateral supraclavicular region, lower neck and upper neck  . Lung cancer     RLL    Past Surgical History  Procedure Laterality Date  . Partial hysterectomy    . Lymphadenectomy    . Video bronchoscopy Bilateral 02/02/2013    Procedure: VIDEO BRONCHOSCOPY WITHOUT FLUORO;  Surgeon: [name], MD;  Location: [facility];  Service: Cardiopulmonary;  Laterality: Bilateral;  . Colonoscopy      Family History  Problem Relation Age of Onset  . Emphysema Father     smoked  . Emphysema Sister     smoked  . Heart disease Mother   . Leukemia Father     Social History:  reports that she quit smoking about 9 months ago. Her smoking use included Cigarettes. She has a 55 pack-year smoking history. She has never used smokeless tobacco. She reports that she does not drink alcohol or use illicit drugs.  Allergies: No Known Allergies  Medications:  Prior to Admission medications   Medication Sig Start Date End Date Taking? Authorizing Provider  albuterol (PROVENTIL HFA) 108 (90 BASE) MCG/ACT inhaler Inhale 2 puffs into the lungs every 6 (six)  hours as needed for wheezing or shortness of breath.   Yes Historical Provider, MD  albuterol (PROVENTIL) (2.5 MG/3ML) 0.083% nebulizer solution Take 2.5 mg by nebulization 4 (four) times daily.   Yes Historical Provider, MD  diazepam (VALIUM) 5 MG tablet Take 5 mg by mouth 3 (three) times daily.   Yes Historical Provider, MD  docusate sodium (COLACE) 100 MG capsule Take 100 mg by mouth 2 (two) times daily.   Yes Historical Provider, MD  famotidine (PEPCID) 20 MG tablet Take 20 mg by  mouth at bedtime as needed for heartburn.   Yes Historical Provider, MD  fentaNYL (DURAGESIC) 12 MCG/HR Place 1 patch (12.5 mcg total) onto the skin every 3 (three) days. 02/08/13  Yes Jonna Coup, MD  megestrol (MEGACE) 20 MG tablet Take 40 mg by mouth 2 (two) times daily.    Yes Historical Provider, MD  methocarbamol (ROBAXIN) 500 MG tablet Take 500 mg by mouth at bedtime.   Yes Historical Provider, MD  omeprazole (PRILOSEC) 10 MG capsule Take 10 mg by mouth daily.   Yes Historical Provider, MD  oxyCODONE (OXY IR/ROXICODONE) 5 MG immediate release tablet Take 5-15 mg by mouth every 4 (four) hours as needed for pain.   Yes Historical Provider, MD  tiotropium (SPIRIVA) 18 MCG inhalation capsule Place 18 mcg into inhaler and inhale every evening.    Yes Historical Provider, MD    ROS:   Constitutional:  Denies F/C/S, +anorexia, +weight loss of 20 lbs in several mo HEENT:  No HA, visual changes, earache, nasal symptoms, sore throat, +hoarseness. Resp:  +cough, +sputum, +hemoptysis; +SOB & wheezing. Cardio:  +CP, +palpit, +DOE w/ ADLs, no orthopnea, no edema. GI:  Denies N/V/D/C or blood in stool; +reflux, no abd pain/ distention/ gas. GU:  No dysuria, freq, urgency, hematuria, or flank pain. MS:  Denies joint pain, swelling, tenderness, +decr ROM & she hurts all over Neuro:  No tremors, seizures, +dizziness, denies syncope, +weakness w/ gait abn. Skin:  No suspicious lesions or skin rash. Heme:  No adenopathy, bruising, bleeding. Psyche: + recent confusion & anxiety, depression.   PHYSICAL EXAM:    Temp:  [99.3 F (37.4 C)] 99.3 F (37.4 C) (09/06 1331) Pulse Rate:  [91-133] 91 (09/06 1615) Resp:  [15-27] 18 (09/06 1615) BP: (90-120)/(54-77) 113/72 mmHg (09/06 1615) SpO2:  [94 %-100 %] 100 % (09/06 1615) FiO2 (%):  [50 %] 50 % (09/06 1331)   No intake or output data in the 24 hours ending 02/11/13 1623  General:  Very thin, cachectic, weak- 72 y/o WF chr ill appearing & dyspneic,  sl confused, etc... HEENT:  /AT; Conjunctiva- pink, Sclera- nonicteric, EOM-wnl, PERRLA, EACs-clear, TMs-wnl; NOSE-clear; THROAT-clear  Neck:  Supple w/ decrROM; no JVD; normal carotid impulses w/o bruits; no thyromegaly or nodules palpated; no lymphadenopathy. Chest:  Decr BS on right w/ rhochi & pleural rub; scat left sided rhonchi & wheezing... Heart:  Regular Rhythm; tachycardia, norm S1 & S2 w/ gr1/6 SEM w/o rubs or gallops detected... Abdomen:  Soft & nontender- no guarding or rebound; normal bowel sounds; no organomegaly or masses palpated. Ext:  +arthritic changes; no varicose veins, venous insuffic, or edema (very thin legs);  prox pulses intact w/o bruits. Neuro:   Sl confused, no focal neuro deficits Derm:  No lesions noted; no rash etc. Lymph:  No cervical, supraclavicular, axillary, or inguinal adenopathy palpated.   LAB RESULT Lab Results  Component Value Date   CREATININE 0.52 02/11/2013   BUN 16  02/11/2013   NA 139 02/11/2013   K 3.9 02/11/2013   CL 97 02/11/2013   CO2 29 02/11/2013   Lab Results  Component Value Date   WBC 22.7* 02/11/2013   HGB 12.1 02/11/2013   HCT 38.2 02/11/2013   MCV 86.4 02/11/2013   PLT 487* 02/11/2013   Lab Results  Component Value Date   ALT 15 02/11/2013   AST 15 02/11/2013   ALKPHOS 94 02/11/2013   BILITOT 0.4 02/11/2013   No results found for this basename: INR, PROTIME   IMAGING:   CXR 9/6 shows right sided vol loss, atelec, effusion, likely from obstructing right hilar adenop & mass     Assessment and Plan:     1)  Right lung Squamous cell ca w/ obstruction of right bronchus intermedius & resulting RLL pneumonia/ atelectasis/ effusion, r/o pleural involvement from the cancer...  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  4)  Hx of H&N squamous cell cancer found on bx of left neck lymph node in 2004- s/p XRT & chemo, details unknown...  5)  Compression fx  T4, Kyphosis, DDD, Osteopenia  I had a long discussion w/ pt and family (son & daugh), and pt's brother as well> they are all in agreement w/ pt's stated wishes for DNR/ DNI/ NCB but they desire treatment giving her a chance for improvement short of these aggressive modes (ie they desire antibiotics, solumedrol, breathing treatments) in addition to comfort measures and supportive care... They understand that her prognosis is poor...  - continue Oxygen therapy - start IV Solumedrol 80mg  Q12h - start IV Zosyn & Vancomycin empirically (we will attempt to get sput for C&S) - NEBS w/ Albut & Ipratropium Q6h & prn - comfort meds w/ Duragesic-12 Q3d, Percocet5 Q4h prn, Valium2 prn, etc... - if she survives to disch- she may need NHP for rehab vs hospice home health...   Harper Smoker M 02/11/2013, 4:23 PM

## 2013-02-11 NOTE — ED Provider Notes (Signed)
CSN: [id number]     Arrival date & time 02/11/13  1243 History   First MD Initiated Contact with Patient 02/11/13 1253    Level V due to patient's acute illness. Chief Complaint  Patient presents with  . Shortness of Breath   (Consider location/radiation/quality/duration/timing/severity/associated sxs/prior Treatment) HPI Arno is a 72 year old female with a recent diagnosis of lung cancer. She has been seen by oncology at [facility] and is scheduled to start radiation therapy at [facility]. Her daughter states that she has had increasing shortness of breath and weakness over the past several days. Today she was weak and needed to be assisted to the bathroom at which time she became extremely diaphoretic and more dyspneic. She has had decreased by mouth intake and has ongoing weight loss.  The patient is unable to give specifics do to her BiPAP and dyspnea at this time. She is on home oxygen at 2 L per minute by daughter states that she had low saturations this morning on the oxygen. EMS states she was having in the 80s when they arise wishes not on oxygen at that time. She has had ongoing cough which worsened today. Past Medical History  Diagnosis Date  . Heart murmur   . Emphysema   . Lymph node cancer 2004  . History of radiation therapy 09/06/2002-10/31/2002    left neck incision, bilateral supraclavicular region, lower neck and upper neck  . Lung cancer     RLL   Past Surgical History  Procedure Laterality Date  . Partial hysterectomy    . Lymphadenectomy    . Video bronchoscopy Bilateral 02/02/2013    Procedure: VIDEO BRONCHOSCOPY WITHOUT FLUORO;  Surgeon: [name], MD;  Location: [facility];  Service: Cardiopulmonary;  Laterality: Bilateral;  . Colonoscopy     Family History  Problem Relation Age of Onset  . Emphysema Father     smoked  . Emphysema Sister     smoked  . Heart disease Mother   . Leukemia Father    History  Substance Use Topics  . Smoking  status: Former Smoker -- 1.00 packs/day for 55 years    Types: Cigarettes    Quit date: 05/08/2012  . Smokeless tobacco: Never Used  . Alcohol Use: No   OB History   Grav Para Term Preterm Abortions TAB SAB Ect Mult Living                 Review of Systems  Unable to perform ROS   Allergies  Review of patient's allergies indicates no known allergies.  Home Medications   Current Outpatient Rx  Name  Route  Sig  Dispense  Refill  . albuterol (PROVENTIL HFA) 108 (90 BASE) MCG/ACT inhaler   Inhalation   Inhale 2 puffs into the lungs every 6 (six) hours as needed for wheezing or shortness of breath.         Marland Kitchen albuterol (PROVENTIL) (2.5 MG/3ML) 0.083% nebulizer solution   Nebulization   Take 2.5 mg by nebulization 4 (four) times daily.         . diazepam (VALIUM) 5 MG tablet   Oral   Take 5 mg by mouth 3 (three) times daily.         . famotidine (PEPCID) 20 MG tablet   Oral   Take 20 mg by mouth at bedtime as needed for heartburn.         . fentaNYL (DURAGESIC) 12 MCG/HR   Transdermal   Place  1 patch (12.5 mcg total) onto the skin every 3 (three) days.   10 patch   0   . megestrol (MEGACE) 20 MG tablet   Oral   Take 40 mg by mouth 2 (two) times daily. Takes 2 tablets twice a day         . methocarbamol (ROBAXIN) 500 MG tablet   Oral   Take 500 mg by mouth at bedtime.         Marland Kitchen omeprazole (PRILOSEC) 10 MG capsule   Oral   Take 10 mg by mouth daily.         Marland Kitchen oxyCODONE (OXY IR/ROXICODONE) 5 MG immediate release tablet      Up to 1-3 every 4 hours as needed   90 tablet   0   . tiotropium (SPIRIVA) 18 MCG inhalation capsule   Inhalation   Place 18 mcg into inhaler and inhale daily.          BP 115/77  Pulse 118  Temp(Src) 99.3 F (37.4 C) (Rectal)  Resp 25  SpO2 98% Physical Exam  Vitals reviewed. Constitutional: She is oriented to person, place, and time. She appears well-developed.  Cachectic female who appears to be in respiratory  distress.  HENT:  Head: Normocephalic and atraumatic.  BiPAP face mask is in place. Lips appear cracked and dried.  Eyes: Conjunctivae and EOM are normal. Pupils are equal, round, and reactive to light.  Neck: Normal range of motion.  Cardiovascular: Tachycardia present.   Pulmonary/Chest: She has decreased breath sounds.  Abdominal: Soft. Bowel sounds are normal. She exhibits no distension and no mass. There is no tenderness. There is no rebound and no guarding.  Musculoskeletal: Normal range of motion. She exhibits no edema and no tenderness.  Neurological: She is oriented to person, place, and time.  Skin: Skin is warm and dry.  Psychiatric: She has a normal mood and affect.    ED Course  Procedures (including critical care time) Labs Review Labs Reviewed  CBC WITH DIFFERENTIAL - Abnormal; Notable for the following:    WBC 22.7 (*)    Platelets 487 (*)    Neutrophils Relative % 92 (*)    Neutro Abs 20.9 (*)    Lymphocytes Relative 3 (*)    Lymphs Abs 0.6 (*)    Monocytes Absolute 1.2 (*)    All other components within normal limits  COMPREHENSIVE METABOLIC PANEL - Abnormal; Notable for the following:    Glucose, Bld 134 (*)    Albumin 2.8 (*)    All other components within normal limits  BLOOD GAS, ARTERIAL   Imaging Review Dg Chest Port 1 View  02/11/2013   *RADIOLOGY REPORT*  Clinical Data: Shortness of breath.  PORTABLE CHEST - 1 VIEW  Comparison: Chest x-Tarae Wooden 04/07/2010.  Findings: Shift of cardiomediastinal structures toward the right hemithorax.  Dense opacification in the medial aspect of the right hemithorax, completely obscuring the right hemidiaphragm, concerning for complete right lower lobe atelectasis.  The ill- defined interstitial and airspace opacities throughout the inferior aspect of the right middle lobe and right upper lobe.  Potential pulmonary nodule in the right mid lung measuring approximately 1.7 x 1.4 cm.  Left lung appears clear.  No definite pleural  effusions. No evidence of pulmonary edema.  Heart size appears normal. Atherosclerosis of the thoracic aorta.  IMPRESSION: 1.  Findings, as above, concerning for complete right lower lobe atelectasis, potentially related to obstructing right hilar adenopathy or right hilar mass.  There  is also a suspicious appearing nodular density in the right mid lung.  Further evaluation with contrast enhanced chest CT is strongly recommended at this time to exclude underlying neoplasm. 2.  Patchy interstitial and airspace disease throughout the inferior aspect of the right upper and middle lobes may represent postobstructive pneumonitis or pneumonia. 3.  Atherosclerosis.   Original Report Authenticated By: Trudie Reed, M.D.    Date: 02/11/2013  Rate: 128  Rhythm: sinus tachycardia  QRS Axis: normal  Intervals: normal  ST/T Wave abnormalities: nonspecific ST changes  Conduction Disutrbances:none  Narrative Interpretation:   Old EKG Reviewed: rate has increased by 29 beats/minute sine prior ekg of 16 Oct 2008   MDM  No diagnosis found. I have discussed palliative care wishes with the patient's son and daughter. The patient was present also. The daughter and son state that they have previously discussed this with her mother and that she did not want to  Intubation or CPR or aggressive resuscitation.  72 year old female with history of lung disease, smoking and lung cancer who presents today with increased dyspnea and shortness of breath and weakness. She has increased white blood cell count at 22,700 and probable post obstructive pneumonia. She is being treated with Zosyn and vancomycin. She is on BiPAP and appears reasonably stable. I will consult and pulmonary service for assistance with further management.   Discussed with Dr. Kriste Basque and pulmonary will be in to evaluate.  In the interim, the bipap will be discontinued and will use [place].  If she is desaturating, abg will be checked prior to bipap restart.     CRITICAL CARE Performed by: Hilario Quarry Total critical care time: 45 Critical care time was exclusive of separately billable procedures and treating other patients. Critical care was necessary to treat or prevent imminent or life-threatening deterioration. Critical care was time spent personally by me on the following activities: development of treatment plan with patient and/or surrogate as well as nursing, discussions with consultants, evaluation of patient's response to treatment, examination of patient, obtaining history from patient or surrogate, ordering and performing treatments and interventions, ordering and review of laboratory studies, ordering and review of radiographic studies, pulse oximetry and re-evaluation of patient's condition.    Hilario Quarry, MD 02/11/13 [phone]

## 2013-02-12 ENCOUNTER — Inpatient Hospital Stay (HOSPITAL_COMMUNITY): Payer: Medicare Other

## 2013-02-12 LAB — BASIC METABOLIC PANEL
BUN: 14 mg/dL (ref 6–23)
Calcium: 9.7 mg/dL (ref 8.4–10.5)
GFR calc non Af Amer: >90 mL/min (ref >90)
Glucose, Bld: 112 mg/dL — ABNORMAL HIGH (ref 70–99)
Sodium: 140 mEq/L (ref 135–145)

## 2013-02-12 LAB — CBC
MCH: 27.1 pg (ref 26.0–34.0)
MCHC: 32 g/dL (ref 30.0–36.0)
Platelets: 395 10*3/uL (ref 150–400)

## 2013-02-12 NOTE — H&P (Signed)
Patient name: Meredith Alexander Medical record number: [id number] Date of birth: 07/07/1940 Age: 72 y.o. 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  Referred to [facility] for XRT & this is in the process of being arranged in [facility] along w/ Oncology consult.  She has become progressively weaker, more dyspneic, cough w/ streaky hemoptysis, FTT at home & family brought her to the ER 02/11/13 for eval => progressive RLL atelectasis, prob post obstructive pneumonia, WBC=22K, etc.  Pt requests and family (son & daughter) confirms request for DNR/ NCB/ DNI but they request oxygen, resp treatment, antibiotics, etc in addition to comfort care.  They understand that her prognosis is poor...  Lines/tubes  Culture data/sepsis markers 9/6 Sput requested for C&S>>   Antibiotics 9/6 Zosyn>>  9/6 Vanco>>  Best practice 9/6 no DVT prophylaxis ordered due to hemoptysis hx 9/6 Protonix40 Bid  Protocols/consults  Events/studies   HPI:  72 y/o WF referred to DrWert 01/31/13 w/ 32mo hx going downhill, thought to have pneumonia by LMD but indolent progressive course worse x18mo w/ poor appetite, 20 lb wt loss, cough w/ hemoptysis, incr SOB w/ ADLs, & not much better despite SABA/ Spiriva/ Pred/ Levaquin...  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    Chest CT in [facility] 01/27/13 showed obstructing/ encasing subcarinal mass (5-6cm) extending to the right hilum, right effusion, severe emphysema, coronary calcif, infrarenal AAA measuring 4cm, T4 compression, osteopenia, degen disc dis, etc...     Bronchoscopy w/ Bx 02/02/13 by DrWert showed  endobronch obstruction from a friable tumor mass in the bronchus intermedius; Path= Squamous Cell Ca    CXR 9/6 shows right sided vol loss, atelec, effusion, likely from obstructing right hilar adenop & mass   Medications:  . albuterol  2.5 mg Nebulization Q6H  . fentaNYL  12.5 mcg Transdermal Q72H  . guaiFENesin  600 mg Oral BID  . ipratropium  0.5 mg Nebulization Q6H  . methylPREDNISolone (SOLU-MEDROL) injection  80 mg Intravenous Q12H  . pantoprazole  40 mg Oral BID  . piperacillin-tazobactam (ZOSYN)  IV  3.375 g Intravenous Q8H  . vancomycin  750 mg Intravenous Q24H     PHYSICAL EXAM:    Temp:  [97.7 F (36.5 C)-99.3 F (37.4 C)] 97.9 F (36.6 C) (09/07 0529) Pulse Rate:  [84-133] 110 (09/07 0529) Resp:  [13-27] 18 (09/07 0529) BP: (90-156)/(50-89) 156/89 mmHg (09/07 0529) SpO2:  [94 %-100 %] 98 % (09/07 0529) FiO2 (%):  [50 %] 50 % (09/06 1331) Weight:  [36.288 kg (80 lb)] 36.288 kg (80 lb) (09/06 1758)   No intake or output data in the 24 hours ending 02/12/13 0901  General:  Very thin, cachectic, weak- 72 y/o WF chr ill appearing & dyspneic, sl confused, etc... HEENT:  [place]/AT; Conjunctiva- pink, Sclera- nonicteric, EOM-wnl, PERRLA, EACs-clear, TMs-wnl; NOSE-clear; THROAT-clear  Neck:  Supple w/ decrROM; no JVD; normal carotid impulses w/o bruits; no thyromegaly or nodules palpated; no lymphadenopathy. Chest:  Decr BS on right w/ rhochi & pleural rub; scat left sided rhonchi & wheezing... Heart:  Regular Rhythm;  tachycardia, norm S1 & S2 w/ gr1/6 SEM w/o rubs or gallops detected... Abdomen:  Soft & nontender- no guarding or rebound; normal bowel sounds; no organomegaly or masses palpated. Ext:  +arthritic changes; no varicose veins, venous insuffic, or edema (very thin legs);  prox pulses intact w/o bruits. Neuro:   Sl confused, no focal neuro deficits Derm:  No lesions noted; no rash etc. Lymph:  No cervical, supraclavicular, axillary, or inguinal adenopathy  palpated.   LAB RESULT Lab Results  Component Value Date   CREATININE 0.50 02/12/2013   BUN 14 02/12/2013   NA 140 02/12/2013   K 3.6 02/12/2013   CL 100 02/12/2013   CO2 28 02/12/2013   Lab Results  Component Value Date   WBC 14.1* 02/12/2013   HGB 11.4* 02/12/2013   HCT 35.6* 02/12/2013   MCV 84.8 02/12/2013   PLT 395 02/12/2013   Lab Results  Component Value Date   ALT 15 02/11/2013   AST 15 02/11/2013   ALKPHOS 94 02/11/2013   BILITOT 0.4 02/11/2013   No results found for this basename: INR,  PROTIME   IMAGING:   CXR 9/6 shows right sided vol loss, atelec, effusion, likely from obstructing right hilar adenop & mass    Assessment and Plan:     1)  Right lung Squamous cell ca w/ obstruction of right bronchus intermedius & resulting RLL pneumonia/ atelectasis/ effusion, r/o pleural involvement from the cancer...  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  4)  Hx of H&N squamous cell cancer found on bx of left neck lymph node in 2004- s/p XRT & chemo, details unknown...  5)  Compression fx T4, Kyphosis, DDD, Osteopenia  I had a long discussion w/ pt and family (son & daugh), and pt's brother as well> they are all in agreement w/ pt's stated wishes for DNR/ DNI/ NCB but they desire treatment giving her a chance for improvement (ie they desire antibiotics, solumedrol, breathing treatments) in addition to comfort measures and supportive care... They understand that her prognosis is poor...  - continue Oxygen therapy - cont IV Solumedrol 80mg  Q12h - cont IV Zosyn & Vancomycin empirically (we will attempt to get sput for C&S) - NEBS w/ Albut & Ipratropium Q6h & prn - comfort meds w/ Duragesic-12 Q3d, Percocet5 Q4h prn, Valium2 prn, etc... - if she survives to disch- she may need NHP for rehab vs hospice home health...   Alexxa Sabet M 02/12/2013, 9:01 AM

## 2013-02-13 ENCOUNTER — Inpatient Hospital Stay (HOSPITAL_COMMUNITY): Payer: Medicare Other

## 2013-02-13 ENCOUNTER — Ambulatory Visit
Admit: 2013-02-13 | Discharge: 2013-02-13 | Disposition: A | Discharge status: Home / Self Care | Payer: Medicare Other | Attending: Radiation Oncology | Admitting: Radiation Oncology

## 2013-02-13 DIAGNOSIS — R222 Localized swelling, mass and lump, trunk: Secondary | ICD-10-CM

## 2013-02-13 DIAGNOSIS — Z515 Encounter for palliative care: Secondary | ICD-10-CM

## 2013-02-13 LAB — TSH: TSH: 0.64 u[IU]/mL (ref 0.350–4.500)

## 2013-02-13 MED ORDER — GUAIFENESIN 100 MG/5ML PO SYRP
300.0000 mg | ORAL_SOLUTION | Freq: Four times a day (QID) | ORAL | Status: DC
  Administered 2013-02-14 – 2013-02-17 (×2): 300 mg via ORAL
  Filled 2013-02-13 (×19): qty 15

## 2013-02-13 MED ORDER — DIAZEPAM 5 MG PO TABS
5.0000 mg | ORAL_TABLET | Freq: Three times a day (TID) | ORAL | Status: DC | PRN
  Administered 2013-02-14 – 2013-02-17 (×4): 5 mg via ORAL
  Filled 2013-02-13 (×4): qty 1

## 2013-02-13 MED ORDER — METHYLPREDNISOLONE SODIUM SUCC 40 MG IJ SOLR
40.0000 mg | Freq: Two times a day (BID) | INTRAMUSCULAR | Status: DC
  Administered 2013-02-13 – 2013-02-15 (×4): 40 mg via INTRAVENOUS
  Filled 2013-02-13 (×6): qty 1

## 2013-02-13 MED ORDER — GUAIFENESIN ER 600 MG PO TB12: 600.0000 mg | ORAL_TABLET | Freq: Two times a day (BID) | ORAL | Status: DC

## 2013-02-13 MED ORDER — PANTOPRAZOLE SODIUM 40 MG PO PACK
40.0000 mg | PACK | Freq: Two times a day (BID) | ORAL | Status: DC
  Administered 2013-02-13 – 2013-02-17 (×7): 40 mg
  Filled 2013-02-13 (×12): qty 20

## 2013-02-13 MED ORDER — DOCUSATE SODIUM 50 MG/5ML PO LIQD
100.0000 mg | Freq: Two times a day (BID) | ORAL | Status: DC
  Administered 2013-02-14 – 2013-02-15 (×3): 100 mg via ORAL
  Filled 2013-02-13 (×7): qty 10

## 2013-02-13 MED ORDER — NYSTATIN 100000 UNIT/ML MT SUSP
5.0000 mL | Freq: Two times a day (BID) | OROMUCOSAL | Status: DC
  Administered 2013-02-13 – 2013-02-17 (×7): 500000 [IU] via ORAL
  Filled 2013-02-13 (×12): qty 5

## 2013-02-13 MED ORDER — DOCUSATE SODIUM 100 MG PO CAPS
100.0000 mg | ORAL_CAPSULE | Freq: Two times a day (BID) | ORAL | Status: DC
  Administered 2013-02-13: 100 mg via ORAL
  Filled 2013-02-13 (×2): qty 1

## 2013-02-13 MED ORDER — BISACODYL 5 MG PO TBEC
10.0000 mg | DELAYED_RELEASE_TABLET | Freq: Every day | ORAL | Status: DC | PRN
  Filled 2013-02-13: qty 1

## 2013-02-13 NOTE — Progress Notes (Signed)
Patient name: Meredith Alexander Medical record number: [id number] Date of birth: 12-16-1940 Age: 72 y.o. 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  Referred to [facility] for XRT & this was in the process of being arranged in [facility] along w/ Oncology consult.  She has become progressively weaker, more dyspneic, cough w/ streaky hemoptysis, FTT at home & family brought her to the ER 02/11/13 for eval => progressive RLL atelectasis, prob post obstructive pneumonia, WBC=22K, etc.  Pt requests and family (son & daughter) confirms request for DNR/ NCB/ DNI but they request oxygen, resp treatment, antibiotics, etc in addition to comfort care.  They understand that her prognosis is poor...  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  Lines/tubes  Culture data/sepsis markers 9/6 Sput requested for C&S>>   Antibiotics 9/6 Zosyn>>  9/6 Vanco>>  Best practice 9/6 no DVT prophylaxis ordered due to hemoptysis hx 9/6 Protonix40 Bid  Protocols/consults  Events/studies    Chest CT [facility] 01/27/13 showed obstructing/ encasing subcarinal mass (5-6cm) extending to the right hilum, right effusion, severe emphysema, coronary calcif, infrarenal AAA measuring 4cm, T4 compression, osteopenia, degen disc dis, etc...     Bronchoscopy w/ Bx 02/02/13 by DrWert showed endobronch obstruction from a friable tumor mass in the bronchus intermedius; Path= Squamous Cell Ca    SUBJ - afebrile, denies CP More lucid per daughter but remains weak  PHYSICAL EXAM:    Temp:  [97.3 F (36.3 C)-97.6 F (36.4 C)] 97.6 F (36.4 C) (09/08  0634) Pulse Rate:  [79-93] 79 (09/08 0634) Resp:  [16-18] 16 (09/08 0634) BP: (118-147)/(67-87) 138/73 mmHg (09/08 0634) SpO2:  [98 %-100 %] 100 % (09/08 0634)    Intake/Output Summary (Last 24 hours) at 02/13/13 0956 Last data filed at 02/13/13 0900  Gross per 24 hour  Intake    240 ml  Output      0 ml  Net    240 ml    General:  Very thin, cachectic, weak- 72 y/o WF chr ill appearing & dyspneic, etc... HEENT:  [place]/AT; Conjunctiva- pink, Sclera- nonicteric, EOM-wnl, PERRLA, EACs-clear, TMs-wnl; NOSE-clear; THROAT-clear  Neck:  Supple w/ decrROM; no JVD; normal carotid impulses w/o bruits; no thyromegaly or nodules palpated; no lymphadenopathy. Chest:  Decr BS on right w/ rhochi & pleural rub; scat left sided rhonchi & wheezing... Heart:  Regular Rhythm; tachycardia, norm S1 & S2 w/ gr1/6 SEM w/o rubs or gallops detected... Abdomen:  Soft & nontender- no guarding or rebound; normal bowel sounds; no organomegaly or masses palpated. Ext:  +arthritic changes; no varicose veins, venous insuffic, or edema (very thin legs);  prox pulses intact w/o bruits. Neuro:   Sl confused, no focal neuro deficits Derm:  No lesions noted; no rash etc. Lymph:  No cervical, supraclavicular, axillary, or inguinal adenopathy palpated.   LAB RESULT Lab Results  Component Value Date   CREATININE 0.50 02/12/2013   BUN 14 02/12/2013   NA 140 02/12/2013   K 3.6 02/12/2013   CL 100 02/12/2013   CO2 28 02/12/2013   Lab Results  Component Value Date   WBC 14.1* 02/12/2013   HGB 11.4* 02/12/2013   HCT 35.6* 02/12/2013   MCV 84.8 02/12/2013   PLT 395 02/12/2013   Lab Results  Component Value Date   ALT 15 02/11/2013   AST 15 02/11/2013   ALKPHOS 94 02/11/2013   BILITOT 0.4 02/11/2013   No results found for this basename: INR,  PROTIME   IMAGING:   CXR 9/8 shows Stable right lower lobe atelectasis/collapse.  likely from obstructing right hilar adenop & mass    Assessment and Plan:     1)  Right lung Squamous cell ca w/  obstruction of right bronchus intermedius & resulting RLL pneumonia/ atelectasis/ effusion, r/o pleural involvement from the cancer...  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  4)  Hx of H&N squamous cell cancer found on bx of left neck lymph node in 2004- s/p XRT & chemo, details unknown...  5)  Compression fx T4, Kyphosis, DDD, Osteopenia  9/ 7 ([place]) Long discussion w/ pt and family (son & daugh), and pt's brother as well> they are all in agreement w/ pt's stated wishes for DNR/ DNI/ NCB but they desire treatment giving her a chance for improvement (ie they desire antibiotics, solumedrol, breathing treatments) in addition to comfort measures and supportive care... They understand that her prognosis is poor...  - continue Oxygen therapy - cont IV Solumedrol 40mg  Q12h - cont IV Zosyn & Vancomycin empirically (we will attempt to get sput for C&S) - NEBS w/ Albut & Ipratropium Q6h & prn - comfort meds w/ Duragesic-12 Q3d, Percocet5 Q4h prn, Valium2 prn, etc... - she may need NHP for rehab vs hospice home health - WIll get palliative care consult for goals of care here - daughter is in agreement & leaning towards hospice. Pt herself would like to know if RT will help her - I feel she is too weak to undergo this - will get Dr Mitzi Hansen to comment. SHe will also need PET & MRI if further therapy is to  Be pursued    Sohrab Keelan V. 02/13/2013, 9:56 AM   [id number]

## 2013-02-13 NOTE — Consult Note (Signed)
Patient Meredith Alexander:JWJXBJ D Meredith Alexander      DOB: 14-Apr-1941      YNW:[id number]     Consult Note from the Palliative Medicine Team at [facility]    Consult Requested by: Dr. Vassie Loll    PCP: Kirstie Peri, MD Reason for Consultation: GOC     Phone [phone] Related symptom recommendations. Assessment of patients Current state:  72 yr old white female with new diagnosis of squamous cell carcinoma.  She initial okayed meeting with her sons and daughter but upon arrival she became withdrawn and did not want to speak.  I had the family step out and talked with her alone.  She stated did you tell them?  I told her I would want her permission to talk openly with her family and they obviously cared about her and wanted to be a part of this process.  She stated she wanted to talk to the "other" doctors before " sharing the news".  She asked if I could come back at another time.  I honored her wishes but asked if I could listen to her families concerns without provided them details that she was worried about . She said yes, that she had told them what she wanted them to know.  I also offered to help her talk with them, which she related she would consider.  Please see my summary note form day of service.  We talked about her symptoms and how to handle them.   Goals of Care: 1.  Code Status: DNR   2. Scope of Treatment: Continue current treatments with pain meds, antibiotics etc while we work through the details.  I support having Dr. Mitzi Hansen talk with her about the radiation treatments . I gently informed her that radiation may help her symptoms but would take the cancer away and so she is not sure how that fits into her philosophy of "if they can't do anything then I just want to go..."   4. Disposition: to be determined .  She is very weak and while she is a hospice candidate , she might also benefit from residential placement depending on her ultimate goals.   3. Symptom  Management:   1. Anxiety/Agitation: as needed valium is appropriate 2. Pain:Fentanyl patch initiated with prn oxycodone will see how this does. Patient is slightly resistant to taking medication 3. Bowel Regimen: patient did not mention but family did , that sh has not had a movement in some time.  She is in no distress.  Add miralax  4. Psychosocial: described as an independent woman  5. Spiritual: unable to fully assess at this time.        Patient Documents Completed or Given: Document Given Completed  Advanced Directives Pkt    MOST    DNR    Gone from My Sight    Hard Choices      Brief HPI: 72 yr old white female recently diagnosed with Squamous cell carcinoma of the lung.  Lesion is threatening to obstruct her bronchus.  We were asked to assist with GOC.   ROS: back pain,  constipation ( per family not patient)    PMH:  Past Medical History  Diagnosis Date  . Heart murmur   . Emphysema   . Lymph node cancer 2004  . History of radiation therapy 09/06/2002-10/31/2002    left neck incision, bilateral supraclavicular region, lower neck and upper neck  . Lung cancer     RLL     PSH: Past Surgical  History  Procedure Laterality Date  . Partial hysterectomy    . Lymphadenectomy    . Video bronchoscopy Bilateral 02/02/2013    Procedure: VIDEO BRONCHOSCOPY WITHOUT FLUORO;  Surgeon: [name], MD;  Location: [facility];  Service: Cardiopulmonary;  Laterality: Bilateral;  . Colonoscopy     I have reviewed the FH and SH and  If appropriate update it with new information. No Known Allergies Scheduled Meds: . albuterol  2.5 mg Nebulization Q6H  . docusate sodium  100 mg Oral BID  . fentaNYL  12.5 mcg Transdermal Q72H  . guaiFENesin  600 mg Oral BID  . ipratropium  0.5 mg Nebulization Q6H  . methylPREDNISolone (SOLU-MEDROL) injection  40 mg Intravenous Q12H  . nystatin  5 mL Oral BID  . pantoprazole sodium  40 mg Per Tube BID  . piperacillin-tazobactam  (ZOSYN)  IV  3.375 g Intravenous Q8H  . vancomycin  750 mg Intravenous Q24H   Continuous Infusions:  PRN Meds:.sodium chloride, acetaminophen, acetaminophen, bisacodyl, diazepam, ondansetron (ZOFRAN) IV, ondansetron, oxyCODONE, traZODone    BP 147/73  Pulse 85  Temp(Src) 97.8 F (36.6 C) (Oral)  Resp 18  Ht 5\' 5"  (1.651 m)  Wt 36.288 kg (80 lb)  BMI 13.31 kg/m2  SpO2 96%   PPS: 40% able to walk to the bathroom with two person assist   Intake/Output Summary (Last 24 hours) at 02/13/13 1703 Last data filed at 02/13/13 1300  Gross per 24 hour  Intake    480 ml  Output      0 ml  Net    480 ml   LBM: 8/25 is last documented movement                      Physical Exam:  General: cachectic fatigued white female , emotional distress more than physical HEENT:  PERRL, EOMI, anicteric mmm Chest:   Decreased but clear anteriorly CVS: regular , S1, S2 Abdomen:schapoid, soft, not tender, positive bowel sounds Ext: thin, cachectic Neuro: seemingly oriented to time place and person,  She relates that she doesn't want [place] to "tell " her family what is going on until she talks with the other doctors.  Labs: CBC    Component Value Date/Time   WBC 14.1* 02/12/2013 0355   RBC 4.20 02/12/2013 0355   HGB 11.4* 02/12/2013 0355   HCT 35.6* 02/12/2013 0355   PLT 395 02/12/2013 0355   MCV 84.8 02/12/2013 0355   MCH 27.1 02/12/2013 0355   MCHC 32.0 02/12/2013 0355   RDW 14.7 02/12/2013 0355   LYMPHSABS 0.6* 02/11/2013 1311   MONOABS 1.2* 02/11/2013 1311   EOSABS 0.0 02/11/2013 1311   BASOSABS 0.0 02/11/2013 1311     CMP     Component Value Date/Time   NA 140 02/12/2013 0355   K 3.6 02/12/2013 0355   CL 100 02/12/2013 0355   CO2 28 02/12/2013 0355   GLUCOSE 112* 02/12/2013 0355   BUN 14 02/12/2013 0355   CREATININE 0.50 02/12/2013 0355   CALCIUM 9.7 02/12/2013 0355   PROT 7.6 02/11/2013 1311   ALBUMIN 2.8* 02/11/2013 1311   AST 15 02/11/2013 1311   ALT 15 02/11/2013 1311   ALKPHOS 94 02/11/2013 1311   BILITOT 0.4 02/11/2013  1311   GFRNONAA >90 02/12/2013 0355   GFRAA >90 02/12/2013 0355    Chest Xray Reviewed/Impressions: Stable right lower lobe atelectasis.Appearance of chest is again compatible with a right hilar  obstructing mass with complete right  lower lobe atelectasis and  extensive postobstructive pneumonitis/pneumonia in the right middle  lobe.    Bronch path: squamous cell carcinoma  Time In Time Out Total Time Spent with Patient Total Overall Time  230 pm 340 pm 70 min 70 min    Greater than 50%  of this time was spent counseling and coordinating care related to the above assessment and plan.   Sura Canul L. Ladona Ridgel, MD MBA The Palliative Medicine Team at [facility] Phone: [phone] Pager: [phone]

## 2013-02-13 NOTE — Consult Note (Signed)
Patient Meredith Alexander      DOB: 05-25-1941      VWU:[id number]  Summary of Goals of Care; full note to follow"  Met with patient , and subsequently with daughter Victorino Dike, and 3 sons Mellody Dance, [employer], and Eustace ) and with a daughter in Armed forces technical officer.  Patient was hesitant to discuss her current condition with her whole family present she initially asked if we could do this at another time.  She permitted me to take her family to the conference area to talk, but I promptly returned to talk with her.  She stated "did you tell them?"   I told her that I had not "told " them anything that I wanted to check with her first since she seemed hesistate to talk with the group present.  She stated she did not want to "tell " them until she talked with the doctors about her condition.  She related that if they could do something to help she would consider it if they could not help then she did not want to suffer or linger.  She relates that she told jennifer her daughter "what to do" but would not elaborate. I told her that her family obviously loved her and wanted to be able to help her get through this and so it might be a good idea to talk openly so they could help where they could.  She asked me if I thought the doctors could do anything. I told her that the radiation at best would help her symptoms but not cure her cancer.  She seems to be only processing a few things at a time .  We agreed to meet again tomorrow, and I told her I would go back and just listen to what her family needed and not tell them anything.  I offered to help her tell them what she thought was important when she was ready.      She is experiencing diffuse back pain.  I encouraged her to take her pain medication and offered to order an air mattress over lay.  Her family asked some questions about hospice care and agreed they did not want to violate her wishes .  At the same time,  They were going to try to engage her privately as most of  them had been present in the ER when Dr. Kriste Basque reviewed her status.     She has already expressed her wishes for no resuscitation to her family and Dr. Kriste Basque.  We will try to help her walk through her other goals in am if she will permit [place].  Discussed with Dr. Vassie Loll  Time: 230 pm - 340 pm  Boneta Standre L. Ladona Ridgel, MD MBA The Palliative Medicine Team at [facility] Phone: [phone] Pager: [phone]

## 2013-02-13 NOTE — Progress Notes (Signed)
UR COMPLETED  

## 2013-02-13 NOTE — Progress Notes (Signed)
Thank you for consulting the Palliative Medicine Team at [facility] to meet your patient's and family's needs.   The reason that you asked [place] to see your patient is for goals of care discussion  We have scheduled your patient for a meeting: today Monday 9/8 @ 2:30 pm  The Surrogate decision maker is: spouse Sharron Petruska [phone]; daughter Rama Sorci c: [phone]  Other family members that need to be present: spoke with patient, daughter, Victorino Dike and 2 sons at bedside they will notify another son and their father regarding meeting time and per Victorino Dike all will be at meeting  Your patient is able to participate- alert and oriented x 3 on this visit stated she was having discomfort with swallowing-staff RN aware; pt  agreeable to meeting as noted above   Valente David, RN 02/13/2013, 11:42 AM Palliative Medicine Team RN Liaison [phone]

## 2013-02-14 ENCOUNTER — Telehealth: Payer: Self-pay | Admitting: *Deleted

## 2013-02-14 DIAGNOSIS — C3400 Malignant neoplasm of unspecified main bronchus: Secondary | ICD-10-CM

## 2013-02-14 DIAGNOSIS — M549 Dorsalgia, unspecified: Secondary | ICD-10-CM

## 2013-02-14 DIAGNOSIS — E43 Unspecified severe protein-calorie malnutrition: Secondary | ICD-10-CM | POA: Insufficient documentation

## 2013-02-14 MED ORDER — OXYCODONE HCL 5 MG PO TABS
10.0000 mg | ORAL_TABLET | ORAL | Status: DC | PRN
  Administered 2013-02-14: 5 mg via ORAL
  Administered 2013-02-14 – 2013-02-16 (×6): 10 mg via ORAL
  Filled 2013-02-14 (×6): qty 2

## 2013-02-14 MED ORDER — ENOXAPARIN SODIUM 30 MG/0.3ML ~~LOC~~ SOLN
30.0000 mg | SUBCUTANEOUS | Status: DC
  Administered 2013-02-15: 30 mg via SUBCUTANEOUS
  Filled 2013-02-14 (×4): qty 0.3

## 2013-02-14 MED ORDER — ENSURE COMPLETE PO LIQD
237.0000 mL | Freq: Two times a day (BID) | ORAL | Status: DC
  Administered 2013-02-14 – 2013-02-17 (×4): 237 mL via ORAL

## 2013-02-14 MED ORDER — POLYETHYLENE GLYCOL 3350 17 G PO PACK
17.0000 g | PACK | Freq: Every day | ORAL | Status: DC
  Administered 2013-02-14 – 2013-02-17 (×4): 17 g via ORAL
  Filled 2013-02-14 (×4): qty 1

## 2013-02-14 NOTE — Progress Notes (Signed)
Patient ZO:XWRUEA D [facility]      DOB: Jan 10, 1941      VWU:[id number]   Palliative Medicine Team at [facility] Progress Note    Subjective:  Visited with patient over her lunch.  Her family brought her food from home which she was trying to enjoy.  She was tearful about talking with her family last evening but seemed stronger and in better spirits.  She states she wants to try radiation and then go from their. Filed Vitals:   02/14/13 1424  BP: 152/89  Pulse: 85  Temp: 98.2 F (36.8 C)  Resp: 16   Physical exam:  Generally: cachectic PERRL, EOMI , anicteric mm Chest : decreased with wt cough Ext : thin and frail   Assessment and plan: 72 yr old white female with bronchoscopy proven squamous cell carcinoma.  Patient admitted with postobstructive pneumonia,generalized weakness and pain.  Currently, pain not bad because she "took all that medicine".  Patient is hesitant to take pain meds.   1.  DNr  2.  Cough/Dyspnea/Dysphagia:  Radiation to mediastinal mass to see if this will help.  Continue prn opiates as patient will allow.  3.  Back pain : awaiting air mattress overlay . Continue prn opiates.  Total time 15 min   Broxton Broady L. Ladona Ridgel, MD MBA The Palliative Medicine Team at [facility] Phone: [phone] Pager: [phone]

## 2013-02-14 NOTE — Telephone Encounter (Signed)
Called  Carolyn,RN for patient in 1302, asked status of patient, ishe is on KVO NS IV, Oxygen 2 liters , n/c, pain now a level 5, asked that the premedicate patient in the morning with her Oxycodone 10mg , and may need her valium as well  For a 9am Ct-Simulation, in Radiation Oncology dept, Sheralyn Boatman and Lorin Picket will come for her between 830-845am, she stated she would probably be her RBN tomorrow but would write down to mediate patient in the am before coming to [place] in Radiation Oncology, thanked Eber Jones, and called Jehenna,radiation therapist and informed her patient room number 1302 and would need to come down in her bed, and has Oxygen and IV fluids, Jehnna verbalized understanding 3:22 PM   3:22 PM

## 2013-02-14 NOTE — Progress Notes (Signed)
Radiation Oncology         [phone] ________________________________  Name: Meredith Alexander MRN: [id number]  Date: 02/11/2013  DOB: February 24, 1941  Progress Note  CC: Kirstie Peri, MD  No ref. provider found  Diagnosis:   Squamous cell carcinoma; bulky tumor within the central chest  Narrative:  The patient is seen today as an inpatient. She is known to me as she was recently seen regarding her recent diagnosis of a biopsy from the mediastinum/central chest which was positive for squamous cell carcinoma. Unclear extent of disease at this time. She does have a history of squamous cell carcinoma of the head and neck region so her current diagnosis may represent a recurrence versus new primary. I haven't scheduled further staging studies including a PET scan and brain MRI scan. She also was scheduled to begin radiation treatment in [place], [place]. This was initially going to represent a palliative course of treatment with further adjustments made if necessary after her studies have been completed. She was also being set up to see medical oncology. The patient was quite weak when she was seen in clinic and we discussed that she may not be a good candidate for aggressive treatment overall.  The patient today states that she is feeling better. She was accompanied by several family members. She indicates that she has been more active today than she has been over the last couple of days. She denies any ongoing further increase shortness of breath. Shee states that her breathing is worse in certain positions.                      ALLERGIES:  has No Known Allergies.  Meds: Current Facility-Administered Medications  Medication Dose Route Frequency Provider Last Rate Last Dose  . 0.9 %  sodium chloride infusion  250 mL Intravenous PRN Michele Mcalpine, MD      . acetaminophen (TYLENOL) tablet 650 mg  650 mg Oral Q6H PRN Michele Mcalpine, MD   650 mg at 02/13/13 1411   Or  . acetaminophen (TYLENOL)  suppository 650 mg  650 mg Rectal Q6H PRN Michele Mcalpine, MD      . albuterol (PROVENTIL) (5 MG/ML) 0.5% nebulizer solution 2.5 mg  2.5 mg Nebulization Q6H Michele Mcalpine, MD   2.5 mg at 02/14/13 0848  . bisacodyl (DULCOLAX) EC tablet 10 mg  10 mg Oral Daily PRN Oretha Milch, MD      . diazepam (VALIUM) tablet 5 mg  5 mg Oral TID PRN Oretha Milch, MD      . docusate (COLACE) 50 MG/5ML liquid 100 mg  100 mg Oral BID Herby Abraham, RPH      . fentaNYL (DURAGESIC - dosed mcg/hr) 12.5 mcg  12.5 mcg Transdermal Q72H Michele Mcalpine, MD   12.5 mcg at 02/11/13 1841  . guaifenesin (ROBITUSSIN) 100 MG/5ML syrup 300 mg  300 mg Oral Q6H Herby Abraham, RPH   300 mg at 02/14/13 0222  . ipratropium (ATROVENT) nebulizer solution 0.5 mg  0.5 mg Nebulization Q6H Michele Mcalpine, MD   0.5 mg at 02/14/13 0849  . methylPREDNISolone sodium succinate (SOLU-MEDROL) 40 mg/mL injection 40 mg  40 mg Intravenous Q12H Oretha Milch, MD   40 mg at 02/14/13 0549  . nystatin (MYCOSTATIN) 100000 UNIT/ML suspension 500,000 Units  5 mL Oral BID Oretha Milch, MD   500,000 Units at 02/14/13 0223  . ondansetron (ZOFRAN) tablet 4 mg  4 mg Oral Q6H PRN Michele Mcalpine, MD       Or  . ondansetron [facility]) injection 4 mg  4 mg Intravenous Q6H PRN Michele Mcalpine, MD      . oxyCODONE (Oxy IR/ROXICODONE) immediate release tablet 5 mg  5 mg Oral Q4H PRN Michele Mcalpine, MD   5 mg at 02/14/13 4540  . pantoprazole sodium (PROTONIX) 40 mg/20 mL oral suspension 40 mg  40 mg Per Tube BID [name], MD   40 mg at 02/13/13 2229  . piperacillin-tazobactam (ZOSYN) IVPB 3.375 g  3.375 g Intravenous Q8H Michele Mcalpine, MD   3.375 g at 02/14/13 0538  . polyethylene glycol (MIRALAX / GLYCOLAX) packet 17 g  17 g Oral Daily Renae Gloss, MD      . traZODone (DESYREL) tablet 25 mg  25 mg Oral QHS PRN Michele Mcalpine, MD      . vancomycin (VANCOCIN) IVPB 750 mg/150 ml premix  750 mg Intravenous Q24H [name], MD   750 mg at 02/13/13 1829     Physical Findings: The patient is in no acute distress. Patient is alert and oriented.  height is 5\' 5"  (1.651 m) and weight is 80 lb (36.288 kg). Her oral temperature is 98.4 F (36.9 C). Her blood pressure is 150/88 and her pulse is 82. Her respiration is 16 and oxygen saturation is 99%. .     Lab Findings: Lab Results  Component Value Date   WBC 14.1* 02/12/2013   HGB 11.4* 02/12/2013   HCT 35.6* 02/12/2013   MCV 84.8 02/12/2013   PLT 395 02/12/2013     Radiographic Findings: Dg Chest Port 1 View  02/13/2013   *RADIOLOGY REPORT*  Clinical Data: Right lung mass with postobstructive atelectasis and pneumonia.  PORTABLE CHEST - 1 VIEW  Comparison: 02/12/2013  Findings: Stable appearance of right lower lobe atelectasis/collapse and underlying COPD.  No edema, pleural fluid or pneumothorax is identified.  The heart size and mediastinal contours are stable.  IMPRESSION: Stable right lower lobe atelectasis/collapse.   Original Report Authenticated By: Irish Lack, M.D.   Portable Chest 1 View  02/12/2013   *RADIOLOGY REPORT*  Clinical Data: Lung cancer.  Chest pain.  Cough.  Shortness of breath.  PORTABLE CHEST - 1 VIEW  Comparison: Chest x-ray 02/11/2013.  Findings: Volume loss in the right hemithorax with a right-sided shift of cardiomediastinal structures again noted.  Appearances compatible with complete right lower lobe atelectasis, likely from a obstructing right hilar mass.  Small right pleural effusion. Compensatory hyperexpansion of the right upper lobe.  Opacities throughout the right middle lobe likely reflects some postobstructive pneumonitis/pneumonia.  Left lung appears relatively well aerated, with exception of a linear opacity at the left base which may reflect subsegmental atelectasis.  No evidence of pulmonary edema.  Heart size is normal.  Extensive atherosclerosis in the thoracic aorta.  IMPRESSION: 1.  Appearance of chest is again compatible with a right hilar obstructing mass  with complete right lower lobe atelectasis and extensive postobstructive pneumonitis/pneumonia in the right middle lobe. 2.  Atherosclerosis.   Original Report Authenticated By: Trudie Reed, M.D.   Dg Chest Port 1 View  02/11/2013   *RADIOLOGY REPORT*  Clinical Data: Shortness of breath.  PORTABLE CHEST - 1 VIEW  Comparison: Chest x-ray 04/07/2010.  Findings: Shift of cardiomediastinal structures toward the right hemithorax.  Dense opacification in the medial aspect of the right hemithorax, completely obscuring the right hemidiaphragm, concerning  for complete right lower lobe atelectasis.  The ill- defined interstitial and airspace opacities throughout the inferior aspect of the right middle lobe and right upper lobe.  Potential pulmonary nodule in the right mid lung measuring approximately 1.7 x 1.4 cm.  Left lung appears clear.  No definite pleural effusions. No evidence of pulmonary edema.  Heart size appears normal. Atherosclerosis of the thoracic aorta.  IMPRESSION: 1.  Findings, as above, concerning for complete right lower lobe atelectasis, potentially related to obstructing right hilar adenopathy or right hilar mass.  There is also a suspicious appearing nodular density in the right mid lung.  Further evaluation with contrast enhanced chest CT is strongly recommended at this time to exclude underlying neoplasm. 2.  Patchy interstitial and airspace disease throughout the inferior aspect of the right upper and middle lobes may represent postobstructive pneumonitis or pneumonia. 3.  Atherosclerosis.   Original Report Authenticated By: Trudie Reed, M.D.    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 The patient was admitted after becoming weaker with increased shortness of breath. The patient had been scheduled to begin palliative  radiotherapy in [place], [place] in the near future.  I discussed with the patient again her overall status. She is weak and does have bulky central tumor regardless of site of origin. Her goals of treatment would only include a palliative treatment given the extent of disease and her performance status at this time. I discussed with her again the possible role of radiotherapy in such a setting which would be somewhat limited. We discussed the logistics of such a treatment which is only available in [facility] here in [place].   Plan:  The patient discussed her wishes both with me and her family. We discussed that pursuing comfort care alone in the absence of additional aggressive treatment would be reasonable for her case. We also revisited our initial plan of palliative radiotherapy to shrink the central tumor and the logistics of such a treatment. The patient in my opinion from seeing her today would likely physically be able to begin a short course of palliative treatment. This may prove incorrect, but she does report improvement in her status as an inpatient. I believe the more important issue is the possible limited efficacy of treatment in such a setting but again this also can be hard to predict.  In the end, the patient discussed her wishes with her family and indicated that she did wish to proceed with palliative radiotherapy. This is a recent diagnosis, and the patient indicated to me that she was not ready at this time to proceed with comfort care alone. We did have a frank discussion about possible prognosis and I believe that she is in the process as is her family of the realization that things could potentially change quickly for her.   I indicated to the patient therefore that I would try to work out the logistics of such a treatment and we will see how she is able to tolerate this. Potentially we will be able to proceed with a simulation on 02/14/2013.   Radene Gunning,  M.D., Ph.D.

## 2013-02-14 NOTE — Progress Notes (Signed)
Patient name: Meredith Alexander Medical record number: [id number] Date of birth: 1940-07-02 Age: 72 y.o. 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  Referred to [facility] for XRT & this was in the process of being arranged in [facility] along w/ Oncology consult.  She has become progressively weaker, more dyspneic, cough w/ streaky hemoptysis, FTT at home & family brought her to the ER 02/11/13 for eval => progressive RLL atelectasis, prob post obstructive pneumonia, WBC=22K, etc.  Pt requests and family (son & daughter) confirms request for DNR/ NCB/ DNI but they request oxygen, resp treatment, antibiotics, etc in addition to comfort care.  They understand that her prognosis is poor...  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  Lines/tubes  Culture data/sepsis markers 9/6 Sput requested for C&S>>   Antibiotics 9/6 Zosyn>>  9/6 Vanco>>9/9  Best practice 9/6 no DVT prophylaxis ordered due to hemoptysis hx 9/6 [id number] Bid  Protocols/consults  Events/studies    Chest CT in [facility] 01/27/13 showed obstructing/ encasing subcarinal mass (5-6cm) extending to the right hilum, right effusion, severe emphysema, coronary calcif, infrarenal AAA measuring 4cm, T4 compression, osteopenia, degen disc dis, etc...     Bronchoscopy w/ Bx 02/02/13 by DrWert showed endobronch obstruction from a friable tumor mass in the bronchus intermedius; Path= Squamous Cell Ca    SUBJ - afebrile, denies CP, dyspnea improving  remains weak  PHYSICAL EXAM:    Temp:  [97.8 F (36.6 C)-98.4 F (36.9 C)] 98.4 F (36.9 C) (09/09 0615) Pulse  Rate:  [82-97] 82 (09/09 0615) Resp:  [16-18] 16 (09/09 0615) BP: (137-150)/(73-88) 150/88 mmHg (09/09 0615) SpO2:  [96 %-100 %] 99 % (09/09 0615)    Intake/Output Summary (Last 24 hours) at 02/14/13 1122 Last data filed at 02/14/13 1034  Gross per 24 hour  Intake    480 ml  Output      0 ml  Net    480 ml    General:  Very thin, cachectic, weak- 72 y/o WF chr ill appearing & dyspneic, etc... HEENT:  [place]/AT; Conjunctiva- pink, Sclera- nonicteric, EOM-wnl, PERRLA, EACs-clear, TMs-wnl; NOSE-clear; THROAT-clear  Neck:  Supple w/ decrROM; no JVD; normal carotid impulses w/o bruits; no thyromegaly or nodules palpated; no lymphadenopathy. Chest:  Decr BS on right w/ rhochi & pleural rub; scat left sided rhonchi & wheezing... Heart:  Regular Rhythm; tachycardia, norm S1 & S2 w/ gr1/6 SEM w/o rubs or gallops detected... Abdomen:  Soft & nontender- no guarding or rebound; normal bowel sounds; no organomegaly or masses palpated. Ext:  +arthritic changes; no varicose veins, venous insuffic, or edema (very thin legs);  prox pulses intact w/o bruits. Neuro:   Sl confused, no focal neuro deficits Derm:  No lesions noted; no rash etc. Lymph:  No cervical, supraclavicular, axillary, or inguinal adenopathy palpated.   LAB RESULT Lab Results  Component Value Date   CREATININE 0.50 02/12/2013   BUN 14 02/12/2013   NA 140 02/12/2013   K 3.6 02/12/2013   CL 100 02/12/2013   CO2 28 02/12/2013   Lab Results  Component  Value Date   WBC 14.1* 02/12/2013   HGB 11.4* 02/12/2013   HCT 35.6* 02/12/2013   MCV 84.8 02/12/2013   PLT 395 02/12/2013   Lab Results  Component Value Date   ALT 15 02/11/2013   AST 15 02/11/2013   ALKPHOS 94 02/11/2013   BILITOT 0.4 02/11/2013   No results found for this basename: INR,  PROTIME   IMAGING:   CXR 9/8 shows Stable right lower lobe atelectasis/collapse.  likely from obstructing right hilar adenop & mass    Assessment and Plan:     1)  Right lung Squamous cell ca w/ obstruction  of right bronchus intermedius & resulting RLL pneumonia/ atelectasis/ effusion, r/o pleural involvement from the cancer...  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  4)  Hx of H&N squamous cell cancer found on bx of left neck lymph node in 2004- s/p XRT & chemo, details unknown...  5)  Compression fx T4, Kyphosis, DDD, Osteopenia  9/ 7 ([place])  DNR/ DNI/ NCB issued  - continue Oxygen therapy - cont IV Solumedrol 40mg  Q24h - cont IV Zosyn  empirically (we will attempt to get sput for C&S) - NEBS w/ Albut & Ipratropium Q6h & prn - comfort meds w/ Duragesic-12 Q3d, Percocet5 Q4h prn, Valium2 prn, etc... - Appreciatepalliative care consult for goals of care here - daughter is in agreement & leaning towards hospice.  - detailed discussion about palliative RT with pt herself & dr Mitzi Hansen separately. Explained to her that this would be of limited benefit with definite concern for side effects such as heartburn & impact on quality of life. Her overall situation is rapidly declining. She would like to proceed with RT & has had this before. Hence will transfer to [facility] with plan for simulation on 9/9. SHe will also need PET & MRI if further therapy is to be pursued . I would still be in favor of discharging her with home hospice by the weekend.   ALVA,RAKESH V. 02/14/2013, 11:22 AM   [phone]

## 2013-02-14 NOTE — Progress Notes (Signed)
INITIAL NUTRITION ASSESSMENT  DOCUMENTATION CODES Per approved criteria  -Severe malnutrition in the context of chronic illness -Underweight   INTERVENTION:  Ensure Complete twice daily (350 kcals, 13 gm protein per 8 fl oz bottle) RD to follow for nutrition care plan  NUTRITION DIAGNOSIS: Increased nutrient needs related to catabolic illness as evidenced by estimated nutrition needs  Goal: Pt to meet >/= 90% of their estimated nutrition needs   Monitor:  PO & supplemental intake, weight, labs, I/O's  Reason for Assessment: Malnutrition Screening Tool Report  72 y.o. female  Admitting Dx: lung cancer & FTT  ASSESSMENT: Patient recently dx with squamous cell lung cancer; become progressively weaker, more dyspneic, had cough w/ streaky hemoptysis and FTT at home; family brought her to [facility] ER.  Palliative Medicine Team note reviewed 9/8.  Patient is currently on a Regular diet; PO intake 75-100% per flowsheet records; patient with visible muscle and subcutaneous fat loss to upper body; likes vanilla Ensure supplements ---> RD to order; transferring to [facility].  Patient meets criteria for severe malnutrition in the context of chronic illness as evidenced by severe muscle loss (clavicles, acromion bone region) and severe subcutaneous fat loss (upper arm region).  Height: Ht Readings from Last 1 Encounters:  02/11/13 5\' 5"  (1.651 m)    Weight: Wt Readings from Last 1 Encounters:  02/11/13 80 lb (36.288 kg)    Ideal Body Weight: 125 lb  % Ideal Body Weight: 64%  Wt Readings from Last 10 Encounters:  02/11/13 80 lb (36.288 kg)  02/08/13 80 lb 11.2 oz (36.605 kg)  01/31/13 83 lb 3.2 oz (37.739 kg)    Usual Body Weight: 83 lb  % Usual Body Weight: 96%  BMI:  Body mass index is 13.31 kg/(m^2).  Estimated Nutritional Needs: Kcal: 1200-1400 Protein: 60-70 gm Fluid: >/= 1.5 L  Skin: Stage I pressure ulcer to sacrum  Diet Order: General  EDUCATION NEEDS: -No  education needs identified at this time   Intake/Output Summary (Last 24 hours) at 02/14/13 1302 Last data filed at 02/14/13 1034  Gross per 24 hour  Intake    240 ml  Output      0 ml  Net    240 ml   Labs:   Recent Labs Lab 02/11/13 1311 02/12/13 0355  NA 139 140  K 3.9 3.6  CL 97 100  CO2 29 28  BUN 16 14  CREATININE 0.52 0.50  CALCIUM 10.4 9.7  GLUCOSE 134* 112*    Scheduled Meds: . albuterol  2.5 mg Nebulization Q6H  . docusate  100 mg Oral BID  . enoxaparin (LOVENOX) injection  30 mg Subcutaneous Q24H  . fentaNYL  12.5 mcg Transdermal Q72H  . guaifenesin  300 mg Oral Q6H  . ipratropium  0.5 mg Nebulization Q6H  . methylPREDNISolone (SOLU-MEDROL) injection  40 mg Intravenous Q12H  . nystatin  5 mL Oral BID  . pantoprazole sodium  40 mg Per Tube BID  . piperacillin-tazobactam (ZOSYN)  IV  3.375 g Intravenous Q8H  . polyethylene glycol  17 g Oral Daily    Continuous Infusions:   Past Medical History  Diagnosis Date  . Heart murmur   . Emphysema   . Lymph node cancer 2004  . History of radiation therapy 09/06/2002-10/31/2002    left neck incision, bilateral supraclavicular region, lower neck and upper neck  . Lung cancer     RLL    Past Surgical History  Procedure Laterality Date  . Partial hysterectomy    .  Lymphadenectomy    . Video bronchoscopy Bilateral 02/02/2013    Procedure: VIDEO BRONCHOSCOPY WITHOUT FLUORO;  Surgeon: [name], MD;  Location: [facility];  Service: Cardiopulmonary;  Laterality: Bilateral;  . Colonoscopy      Maureen Chatters, RD, LDN Pager #: [phone] After-Hours Pager #: [phone]

## 2013-02-14 NOTE — Progress Notes (Signed)
Report called to [facility] [address] [place]. Report was given to [place], Charity fundraiser.

## 2013-02-15 ENCOUNTER — Ambulatory Visit: Payer: Medicare Other | Admitting: Radiation Oncology

## 2013-02-15 ENCOUNTER — Telehealth: Payer: Self-pay | Admitting: *Deleted

## 2013-02-15 ENCOUNTER — Inpatient Hospital Stay (HOSPITAL_COMMUNITY): Payer: Medicare Other

## 2013-02-15 MED ORDER — PREDNISONE 20 MG PO TABS
40.0000 mg | ORAL_TABLET | Freq: Every day | ORAL | Status: DC
  Administered 2013-02-16 – 2013-02-17 (×2): 40 mg via ORAL
  Filled 2013-02-15 (×5): qty 2

## 2013-02-15 MED ORDER — AMOXICILLIN-POT CLAVULANATE 600-42.9 MG/5ML PO SUSR
600.0000 mg | Freq: Two times a day (BID) | ORAL | Status: DC
  Administered 2013-02-15 – 2013-02-17 (×4): 600 mg via ORAL
  Filled 2013-02-15 (×9): qty 5

## 2013-02-15 MED ORDER — MORPHINE SULFATE ER 15 MG PO TBCR
15.0000 mg | EXTENDED_RELEASE_TABLET | Freq: Two times a day (BID) | ORAL | Status: DC
  Administered 2013-02-15 – 2013-02-17 (×4): 15 mg via ORAL
  Filled 2013-02-15 (×4): qty 1

## 2013-02-15 NOTE — Telephone Encounter (Signed)
Carolyn,RN for room 1302,patinet and family discussed and have decided to go home with hospice,declined RT, or CT sim, thanked nurse will notify MD Mitzi Hansen and Ct sim, Arlys John, therapist 8:35 AM

## 2013-02-15 NOTE — Progress Notes (Signed)
Patient name: Meredith Alexander Medical record number: [id number] Date of birth: 05-Aug-1940 Age: 72 y.o. 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  Referred to [facility] for XRT & this was in the process of being arranged in [facility] along w/ Oncology consult.  She has become progressively weaker, more dyspneic, cough w/ streaky hemoptysis, FTT at home & family brought her to the ER 02/11/13 for eval => progressive RLL atelectasis, prob post obstructive pneumonia, WBC=22K, etc.  Pt requests and family (son & daughter) confirms request for DNR/ NCB/ DNI but they request oxygen, resp treatment, antibiotics, etc in addition to comfort care.  They understand that her prognosis is poor...  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  Lines/tubes  Culture data/sepsis markers 9/6 Sput requested for C&S>>   Antibiotics 9/6 Zosyn>>  9/6 Vanco>>9/9  Best practice 9/6 no DVT prophylaxis ordered due to hemoptysis hx 9/6 Protonix40 Bid  Protocols/consults  Events/studies    Chest CT in [facility] 01/27/13 showed obstructing/ encasing subcarinal mass (5-6cm) extending to the right hilum, right effusion, severe emphysema, coronary calcif, infrarenal AAA measuring 4cm, T4 compression, osteopenia, degen disc dis, etc...     Bronchoscopy w/ Bx 02/02/13 by DrWert showed endobronch obstruction from a friable tumor mass in the bronchus intermedius; Path= Squamous Cell Ca 9/9: detailed discussion about palliative RT with pt herself & dr Mitzi Hansen separately. Explained to her that this would be of limited benefit with definite concern for side  effects such as heartburn & impact on quality of life. Her overall situation is rapidly declining. She would like to proceed with RT & has had this before.   SUBJ -c/o pain right chest   PHYSICAL EXAM:    Temp:  [98.2 F (36.8 C)-98.9 F (37.2 C)] 98.6 F (37 C) (09/10 0556) Pulse Rate:  [82-96] 82 (09/10 0556) Resp:  [16] 16 (09/10 0556) BP: (119-152)/(53-89) 119/53 mmHg (09/10 0556) SpO2:  [91 %-100 %] 91 % (09/10 0815) Weight:  [36.288 kg (80 lb)] 36.288 kg (80 lb) (09/09 1424)    Intake/Output Summary (Last 24 hours) at 02/15/13 1126 Last data filed at 02/15/13 0900  Gross per 24 hour  Intake    591 ml  Output    200 ml  Net    391 ml    General:  Very thin, cachectic, weak- 72 y/o WF chr ill appearing & dyspneic, etc... HEENT:  [place]/AT; Conjunctiva- pink, Sclera- nonicteric, EOM-wnl, PERRLA, EACs-clear, TMs-wnl; NOSE-clear; THROAT-clear  Neck:  Supple w/ decrROM; no JVD; normal carotid impulses w/o bruits; no thyromegaly or nodules palpated; no lymphadenopathy. Chest:  Decr BS on right w/ rhochi & pleural rub; scat left sided rhonchi & wheezing... Heart:  Regular Rhythm; tachycardia, norm S1 & S2 w/ gr1/6 SEM w/o rubs or gallops detected... Abdomen:  Soft & nontender- no guarding or rebound; normal bowel sounds; no organomegaly or masses palpated. Ext:  +arthritic changes; no varicose veins, venous insuffic, or edema (very thin legs);  prox pulses intact w/o bruits. Neuro:   Sl confused, no focal neuro deficits Derm:  No  lesions noted; no rash etc. Lymph:  No cervical, supraclavicular, axillary, or inguinal adenopathy palpated.   LAB RESULT Lab Results  Component Value Date   CREATININE 0.50 02/12/2013   BUN 14 02/12/2013   NA 140 02/12/2013   K 3.6 02/12/2013   CL 100 02/12/2013   CO2 28 02/12/2013   Lab Results  Component Value Date   WBC 14.1* 02/12/2013   HGB 11.4* 02/12/2013   HCT 35.6* 02/12/2013   MCV 84.8 02/12/2013   PLT 395 02/12/2013   Lab Results  Component Value Date    ALT 15 02/11/2013   AST 15 02/11/2013   ALKPHOS 94 02/11/2013   BILITOT 0.4 02/11/2013   No results found for this basename: INR,  PROTIME   IMAGING:   CXR 9/8 shows Stable right lower lobe atelectasis/collapse.  likely from obstructing right hilar adenop & mass   Assessment and Plan:     1)   Right lung Squamous cell ca w/ obstruction of right bronchus intermedius & resulting RLL pneumonia/ atelectasis/ effusion, superimposed on severe underlying COPD/ Emphysema- oxygen dependent r/o pleural involvement from the cancer... >she has now decided against going forward w/ XRT (9/9)   Plan - continue Oxygen therapy - change to pred - change zosyn to augmentin - NEBS w/ Albut & Ipratropium Q6h & prn - start MS contin and cont oxy IR, not sure that the patch is working  - no further XRT consideration   2)  Atherosclerotic cardiovasc dis and peripheral vasc dis is evident... Plan No rx   3)  Compression fx T4, Kyphosis, DDD, Osteopenia Plan: Symptomatic care  Plan for dc with home hospice in am  Cyril Mourning MD. FCCP. [facility] Pager [phone] If no response call [phone]

## 2013-02-15 NOTE — Progress Notes (Signed)
Patient requested overlay be removed from bed. Prefers "not to have it on there".

## 2013-02-15 NOTE — Progress Notes (Signed)
Patient AV:[id number] Meredith Alexander      DOB: 1940/06/09      BJY:[id number]  Patient sleeping soundly. No family at bedside.  Will not awaken will follow up in am.  Noted new pain regime will recheck in am. If patient having trouble swallowing, consider schedule morphine immediate release liquid with prn for breakthrough.   Leiann Sporer L. Ladona Ridgel, MD MBA The Palliative Medicine Team at [facility] Phone: [phone] Pager: [phone]

## 2013-02-16 ENCOUNTER — Ambulatory Visit: Payer: Medicare Other

## 2013-02-16 LAB — GLUCOSE, CAPILLARY: Glucose-Capillary: 117 mg/dL — ABNORMAL HIGH (ref 70–99)

## 2013-02-16 MED ORDER — SODIUM CHLORIDE 0.9 % IV SOLN
Freq: Once | INTRAVENOUS | Status: AC
  Administered 2013-02-16: 09:00:00 via INTRAVENOUS

## 2013-02-16 MED ORDER — SENNOSIDES-DOCUSATE SODIUM 8.6-50 MG PO TABS
2.0000 | ORAL_TABLET | Freq: Two times a day (BID) | ORAL | Status: DC
  Administered 2013-02-16 – 2013-02-17 (×2): 2 via ORAL
  Filled 2013-02-16 (×3): qty 2

## 2013-02-16 MED ORDER — FLEET ENEMA 7-19 GM/118ML RE ENEM
1.0000 | ENEMA | Freq: Once | RECTAL | Status: AC
  Administered 2013-02-16: 1 via RECTAL

## 2013-02-16 MED ORDER — FLEET ENEMA 7-19 GM/118ML RE ENEM
1.0000 | ENEMA | Freq: Once | RECTAL | Status: AC
  Administered 2013-02-16: 1 via RECTAL
  Filled 2013-02-16: qty 1

## 2013-02-16 MED ORDER — MORPHINE SULFATE (CONCENTRATE) 10 MG /0.5 ML PO SOLN
10.0000 mg | ORAL | Status: DC | PRN
  Administered 2013-02-16 – 2013-02-17 (×8): 10 mg via SUBLINGUAL
  Filled 2013-02-16 (×8): qty 0.5

## 2013-02-16 NOTE — Progress Notes (Signed)
Stopped to see patient and her family today. When I arrived, patient seemed to be sleeping. Her daughter and a friend is present in the room. Daughter says that other family members have been called to come to see patient. Patient awakens occasionally and asks for more pain medication. Nurses explain that they have more pain medication but if family members are coming, after they visit, then the pain medication can be increased but this will decrease the patient's awareness.  Daughter expressed difficulty with accepting the decline and end of life issues with her mother. She says intellectually that she knew it would come to this, but it has come faster than she was expecting and she just needs to come to a sense of acceptance and peace about her mother's impending death. Listened, gave support and encouragement, and offered prayer to peace and calm in the days ahead.

## 2013-02-16 NOTE — Significant Event (Signed)
Rapid Response Event Note  Overview: Called for generalized pain, hypotension      Initial Focused Assessment: BP 68/46, HR 150 Afib, O2sat 93% on 2L [place]. C/O generalized pain. Emaciated, Poor appetite and No BM for several weeks. Daughter at bedside.   Interventions: Called Dr Vassie Loll and NS fluid bolus given. Discuss daughters concerns about plan at home. Refer to bedside RN to follow up with hospice referral before discharge.   Event Summary: P. Babcock,NP at bedside.  Repeat bolus may be given if hypotension continues. BP 97/69 after initial fluids.    at      at          [facility], Ferd Hibbs

## 2013-02-16 NOTE — Discharge Summary (Signed)
Physician Discharge Summary     Patient ID: Meredith Alexander MRN: [id number] DOB/AGE: Dec 09, 1940 72 y.o.  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 Referred to [facility] for XRT & this was in the process of being arranged in [facility] along w/ Oncology consult. She has become progressively weaker, more dyspneic, cough w/ streaky hemoptysis, FTT at home & family brought her to the ER 02/11/13 for eval => progressive RLL atelectasis, with post obstructive pneumonia, WBC=22K. She was admitted to the hospital, initially at [facility] where she was treated for post-obstructive PNA and exacerbation of her COPD. Given concern about the post-obstructive process Radiation oncology was consulted and lengthy discussion with pulmonary and radiation oncology were had. It was felt that given her advanced state that she would not benefit much from XRT and this was communicated with her. She initially had decided that she wanted to trial XRT anyhow and therefore was transferred to [facility] for this.   The PM hours of 9/9 she had lengthy discussion with her family where she decided against her plan for XRT in favor of palliative approach. Palliative care was consulted. The following days were spent working on symptom management which included right sided chest pain and constipation. She was initially set to go home with hospice on 9/11 but this had to be delayed due to an acute episode early in the am which resulted in transient hypotension. Straining to have  BM-->increased work of breathing-->air trapping-->hypotension. Got fluid challenge and analgesia which she responded to. We continued to work on titration of her home regimen with titration with addition of oral MS contin and PRN roxanol. 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  Plan  - continue Oxygen therapy  - cont pred, with 10d taper  - cont augmentin, will complete 10d total abx   - NEBS w/ Albut & Ipratropium Q6h & prn  - cont MS contin and PRN Roxanol SL  - no further XRT consideration  -home with [facility] [place]   Constipation  Plan  Home on miralax and senokot-S  Atherosclerotic cardiovasc dis and peripheral vasc dis is evident...  Plan  No rx   Compression fx T4, Kyphosis, DDD, Osteopenia  Plan:  Symptomatic care      Significant Hospital tests/ studies/ interventions and procedures   Culture data/sepsis markers    Antibiotics  9/6 Zosyn>>  9/6 Vanco>>9/9   Best practice  9/6 no DVT prophylaxis ordered due to hemoptysis hx  9/6 Protonix40 Bid   Protocols/consults  Events/studies  Chest CT in [facility] 01/27/13 showed obstructing/ encasing subcarinal mass (5-6cm) extending to the right hilum, right effusion, severe emphysema, coronary calcif, infrarenal AAA measuring 4cm, T4 compression, osteopenia, degen disc dis, etc...  Bronchoscopy w/ Bx 02/02/13 by Dr Sherene Sires showed endobronch obstruction from a friable tumor mass in the bronchus  intermedius; Path= Squamous Cell Ca  9/9: tr to [facility] for [facility]RT  Consults Radiation oncology Palliative care   Discharge Exam: BP 154/82  Pulse 102  Temp(Src) 97.3 F (36.3 C) (Oral)  Resp 16  Ht 5\' 5"  (1.651 m)  Wt 36.288 kg (80 lb)   BMI 13.31 kg/m2  SpO2 92%  General: Very thin, cachectic, weak- 72 y/o WF chr ill appearing & dyspneic, etc...  HEENT: [place]/AT; Sclera- nonicteric, EOM-wnl, PERRLA, EACs-clear  Chest: Decr BS on right w/ rhochi & pleural rub; scat left sided rhonchi & wheezing...  Heart: Regular Rhythm; tachycardia, norm S1 & S2 w/ gr1/6 SEM  Abdomen: tender to palp, no guarding or rebound; normal bowel sounds; no organomegaly or masses palpated.  Ext: +arthritic changes; no varicose veins, venous insuffic, or edema (very thin legs); prox pulses intact w/o bruits.  Neuro: Sl confused, no focal neuro deficits  Derm: No lesions noted; no rash etc.  Lymph: No cervical, supraclavicular, axillary, or inguinal adenopathy palpated.   Labs at discharge Lab Results  Component Value Date   CREATININE 0.50 02/12/2013   BUN 14 02/12/2013   NA 140 02/12/2013   K 3.6 02/12/2013   CL 100 02/12/2013   CO2 28 02/12/2013   Lab Results  Component Value Date   WBC 14.1* 02/12/2013   HGB 11.4* 02/12/2013   HCT 35.6* 02/12/2013   MCV 84.8 02/12/2013   PLT 395 02/12/2013   Lab Results  Component Value Date   ALT 15 02/11/2013   AST 15 02/11/2013   ALKPHOS 94 02/11/2013   BILITOT 0.4 02/11/2013   No results found for this basename: INR,  PROTIME    Current radiology studies Dg Chest Port 1 View  02/15/2013   CLINICAL DATA:  72 year old female chest pain weakness shortness of Breath. History of lung cancer.  EXAM: PORTABLE CHEST - 1 VIEW  COMPARISON:  02/13/2013 and earlier.  FINDINGS: Portable AP upright view at 1309 hrs. Interval decreased abnormal right lung base opacity. Continued decreased volume in the right hemi thorax. Right lung base pleural mass or effusion again suspected. The left lung is stable and clear. Stable cardiac size and mediastinal contours. Scoliosis. Insert bone 's  IMPRESSION: Mildly improved ventilation at the right lung base in the setting of right lung base cancer. No new cardiopulmonary abnormality.   Electronically  Signed   By: Augusto Gamble M.D.   On: 02/15/2013 13:54    Disposition:  01-Home or Self Care      Discharge Orders   Future Orders Complete By Expires   Diet - low sodium heart healthy  As directed    Discharge instructions  As directed    Comments:     IF you are short of breath 1) take rescue albuterol NEB, if with in 20 minutes and still short of breath -->take oral morphine 0.5 ml Sublingual If you need to take this every hour for 3 subsequent doses call the hospice nurse If this does not help call the hospice nurse.   Increase activity slowly  As directed        Medication List    STOP taking these medications       docusate sodium 100 MG capsule  Commonly known as:  COLACE     fentaNYL 12 MCG/HR  Commonly known as:  DURAGESIC     methocarbamol 500 MG tablet  Commonly known as:  ROBAXIN     oxyCODONE 5 MG immediate release tablet  Commonly known as:  Oxy IR/ROXICODONE     tiotropium 18 MCG inhalation capsule  Commonly known as:  SPIRIVA      TAKE these medications       acetaminophen 325 MG tablet  Commonly known as:  TYLENOL  Take 2 tablets (650 mg total) by mouth every 6 (six) hours as needed.     albuterol 108 (90 BASE) MCG/ACT inhaler  Commonly known as:  PROVENTIL HFA  Inhale 2 puffs into the lungs every 6 (six) hours as needed for wheezing or shortness of breath.     albuterol (5 MG/ML) 0.5% nebulizer solution  Commonly known as:  PROVENTIL  Take 0.5 mLs (2.5 mg total) by nebulization every 6 (six) hours.     albuterol (2.5 MG/3ML) 0.083% nebulizer solution  Commonly known as:  PROVENTIL  Take 3 mLs (2.5 mg total) by nebulization 4 (four) times daily.     amoxicillin-clavulanate 600-42.9 MG/5ML suspension  Commonly known as:  AUGMENTIN  Take 5 mLs (600 mg total) by mouth every 12 (twelve) hours.     bisacodyl 5 MG EC tablet  Commonly known as:  DULCOLAX  Take 2 tablets (10 mg total) by mouth daily as needed.     diazepam 5 MG tablet  Commonly  known as:  VALIUM  Take 1 tablet (5 mg total) by mouth 3 (three) times daily as needed for anxiety.     famotidine 20 MG tablet  Commonly known as:  PEPCID  Take 20 mg by mouth at bedtime as needed for heartburn.     guaifenesin 100 MG/5ML syrup  Commonly known as:  ROBITUSSIN  Take 15 mLs (300 mg total) by mouth every 6 (six) hours.     ipratropium 0.02 % nebulizer solution  Commonly known as:  ATROVENT  Take 2.5 mLs (0.5 mg total) by nebulization every 6 (six) hours.     megestrol 20 MG tablet  Commonly known as:  MEGACE  Take 40 mg by mouth 2 (two) times daily.     morphine 15 MG 12 hr tablet  Commonly known as:  MS CONTIN  Take 1 tablet (15 mg total) by mouth every 12 (twelve) hours.     morphine 20 MG/ML concentrated solution  Commonly known as:  ROXANOL  Take 0.5 mLs (10 mg total) by mouth every hour as needed for pain (for shortness of breath or pain).     nystatin 100000 UNIT/ML suspension  Commonly known as:  MYCOSTATIN  Take 5 mLs (500,000 Units total) by mouth 2 (two) times daily.     omeprazole 10 MG capsule  Commonly known as:  PRILOSEC  Take 10 mg by mouth daily.     ondansetron 4 MG tablet  Commonly known as:  ZOFRAN  Take 1 tablet (4 mg total) by mouth every 6 (six) hours as needed for nausea.     polyethylene glycol packet  Commonly known as:  MIRALAX / GLYCOLAX  Take 17 g by mouth daily.     predniSONE 10 MG tablet  Commonly known as:  DELTASONE  Take 4 tabs  daily with food x 4 days, then 3 tabs daily x 4 days, then 2 tabs daily x 4 days, then 1 tab daily x4 days then stop. #40     senna-docusate 8.6-50 MG per tablet  Commonly known as:  Senokot-S  Take 2 tablets by mouth 2 (two) times daily.     traZODone 25 mg Tabs tablet  Commonly known as:  DESYREL  Take 0.5 tablets (  25 mg total) by mouth at bedtime as needed (insomnia).         Discharged Condition: fair  Physician Statement:   The Patient was personally examined, the discharge  assessment and plan has been personally reviewed and I agree with ACNP Babcock's assessment and plan. > 30 minutes of time have been dedicated to discharge assessment, planning and discharge instructions.   Signed: BABCOCK,PETE 02/17/2013, 12:14 PM   STAFF  Dr. Kalman Shan, M.D., F.C.C.P Pulmonary and Critical Care Medicine Staff Physician [facility] [place] [facility] Pager: [phone], If no answer or between  15:00h - 7:00h: call [phone]  02/22/2013 7:06 AM

## 2013-02-16 NOTE — Progress Notes (Signed)
Received update from chaplain intern, Verlon Au. Will continue to follow when more family arrive.

## 2013-02-16 NOTE — Care Management Note (Addendum)
Cm spoke with patient and family concerning home hospice. Per pt and daughter [facility] to provide hospice services. Cm spoke Marybeth with [facility] at [phone]. Pt request hospital bed, BSC,RW, nebulizer and bedside table. Per daughter pt has shower chair, and oxygen for home DME use. Cm to fax facesheet, demographics, and progress notes to [phone]. Cm informed hopsice agency to please contact pt's daughter Nolita Kutter at [phone] to arrange DME delivery.   Roxy Manns [place],RN,MSN [phone]

## 2013-02-16 NOTE — Progress Notes (Signed)
Patient name: Meredith Alexander Medical record number: [id number] Date of birth: 20-Feb-1941 Age: 72 y.o. 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  Referred to [facility] for XRT & this was in the process of being arranged in [facility] along w/ Oncology consult.  She has become progressively weaker, more dyspneic, cough w/ streaky hemoptysis, FTT at home & family brought her to the ER 02/11/13 for eval => progressive RLL atelectasis, prob post obstructive pneumonia, WBC=22K, etc.  Pt requests and family (son & daughter) confirms request for DNR/ NCB/ DNI but they request oxygen, resp treatment, antibiotics, etc in addition to comfort care.  They understand that her prognosis is poor...  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  Lines/tubes  Culture data/sepsis markers 9/6 Sput requested for C&S>>   Antibiotics 9/6 Zosyn>>  9/6 Vanco>>9/9  Best practice 9/6 no DVT prophylaxis ordered due to hemoptysis hx 9/6 Protonix40 Bid  Protocols/consults  Events/studies    Chest CT in [facility] 01/27/13 showed obstructing/ encasing subcarinal mass (5-6cm) extending to the right hilum, right effusion, severe emphysema, coronary calcif, infrarenal AAA measuring 4cm, T4 compression, osteopenia, degen disc dis, etc...     Bronchoscopy w/ Bx 02/02/13 by DrWert showed endobronch obstruction from a friable tumor mass in the bronchus intermedius; Path= Squamous Cell Ca 9/9: tr to [facility] for XRT  SUBJ -c/o pain right chest  Episode after straining at stool of hypotension & tachycardia - improved with fluids  PHYSICAL EXAM:    Temp:  [97.5  F (36.4 C)-98.6 F (37 C)] 97.5 F (36.4 C) (09/11 0813) Pulse Rate:  [65-95] 72 (09/11 1000) Resp:  [16-22] 22 (09/11 1000) BP: (93-123)/(41-80) 93/41 mmHg (09/11 0813) SpO2:  [92 %-100 %] 98 % (09/11 1000)    Intake/Output Summary (Last 24 hours) at 02/16/13 1131 Last data filed at 02/16/13 0813  Gross per 24 hour  Intake    494 ml  Output    250 ml  Net    244 ml    General:  Very thin, cachectic, weak- 72 y/o WF chr ill appearing & dyspneic, etc... HEENT:  [place]/AT; Sclera- nonicteric, EOM-wnl, PERRLA, EACs-clear Chest:  Decr BS on right w/ rhochi & pleural rub; scat left sided rhonchi & wheezing... Heart:  Regular Rhythm; tachycardia, norm S1 & S2 w/ gr1/6 SEM  Abdomen: tender to palp, no guarding or rebound; normal bowel sounds; no organomegaly or masses palpated. Ext:  +arthritic changes; no varicose veins, venous insuffic, or edema (very thin legs);  prox pulses intact w/o bruits. Neuro:   Sl confused, no focal neuro deficits Derm:  No lesions noted; no rash etc. Lymph:  No cervical, supraclavicular, axillary, or inguinal adenopathy palpated.   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 >she has now decided against going forward w/ XRT (9/9)  >significant episode of  acute dyspnea f/b what was likely airtrapping and subsequent hypotension the am of 9/11 after trying to have BM.   Plan - continue Oxygen therapy - cont pred - cont augmentin  - NEBS w/ Albut & Ipratropium Q6h & prn - add PRN Roxanol SL  - no further XRT consideration   Transient fluid responsive hypotension/shock. Think that this was d/t airtrapping physiology.  Plan: Cont supportive care Have d/w daughter... Will not treat future episodes w/ volume but rather symptomatic support  Constipation Has not had BM in several days. Reports that her abd hurts. Episode this am straining for BM was likely the precipitating factor for acute distress.  Plan Fleets enema   Atherosclerotic cardiovasc dis and peripheral vasc dis is evident... Plan No rx   Compression fx T4, Kyphosis, DDD, Osteopenia Plan: Symptomatic care   Dispo: We have not met our goals from a stand-point of analgesia. Have added oral SL Roxanol. Will see how she does over the course of the AM and early afternoon. Her daughter Meredith Alexander voiced concerns about her ability to assist her at home of this were to happen again. For now we will continue titration of analgesia, if ends up needing IV support may not be able to go home.   Awaiting [facility] [facility] set up - hope by 9/12    Cyril Mourning MD. FCCP. [place] [facility] Pager [phone] If no response call [phone]

## 2013-02-17 ENCOUNTER — Ambulatory Visit: Payer: Medicare Other

## 2013-02-17 MED ORDER — NYSTATIN 100000 UNIT/ML MT SUSP: 5.0000 mL | Freq: Two times a day (BID) | OROMUCOSAL | Status: AC

## 2013-02-17 MED ORDER — ALBUTEROL SULFATE (2.5 MG/3ML) 0.083% IN NEBU: 2.5000 mg | INHALATION_SOLUTION | Freq: Four times a day (QID) | RESPIRATORY_TRACT | Status: AC

## 2013-02-17 MED ORDER — ACETAMINOPHEN 325 MG PO TABS: 650.0000 mg | ORAL_TABLET | Freq: Four times a day (QID) | ORAL | Status: AC | PRN

## 2013-02-17 MED ORDER — MORPHINE SULFATE (CONCENTRATE) 20 MG/ML PO SOLN: 10.0000 mg | ORAL | Status: AC | PRN

## 2013-02-17 MED ORDER — SENNOSIDES-DOCUSATE SODIUM 8.6-50 MG PO TABS: 2.0000 | ORAL_TABLET | Freq: Two times a day (BID) | ORAL | Status: AC

## 2013-02-17 MED ORDER — IPRATROPIUM BROMIDE 0.02 % IN SOLN: 0.5000 mg | Freq: Four times a day (QID) | RESPIRATORY_TRACT | Status: AC

## 2013-02-17 MED ORDER — ALBUTEROL SULFATE (5 MG/ML) 0.5% IN NEBU: 2.5000 mg | INHALATION_SOLUTION | Freq: Four times a day (QID) | RESPIRATORY_TRACT | Status: AC

## 2013-02-17 MED ORDER — PREDNISONE 10 MG PO TABS: ORAL_TABLET | ORAL | Status: AC

## 2013-02-17 MED ORDER — TRAZODONE 25 MG HALF TABLET: 25.0000 mg | ORAL_TABLET | Freq: Every evening | ORAL | Status: AC | PRN

## 2013-02-17 MED ORDER — POLYETHYLENE GLYCOL 3350 17 G PO PACK: 17.0000 g | PACK | Freq: Every day | ORAL | Status: AC

## 2013-02-17 MED ORDER — MORPHINE SULFATE ER 15 MG PO TBCR: 15.0000 mg | EXTENDED_RELEASE_TABLET | Freq: Two times a day (BID) | ORAL | Status: AC

## 2013-02-17 MED ORDER — ONDANSETRON HCL 4 MG PO TABS: 4.0000 mg | ORAL_TABLET | Freq: Four times a day (QID) | ORAL | Status: AC | PRN

## 2013-02-17 MED ORDER — GUAIFENESIN 100 MG/5ML PO SYRP: 300.0000 mg | ORAL_SOLUTION | Freq: Four times a day (QID) | ORAL | Status: AC

## 2013-02-17 MED ORDER — DIAZEPAM 5 MG PO TABS: 5.0000 mg | ORAL_TABLET | Freq: Three times a day (TID) | ORAL | Status: AC | PRN

## 2013-02-17 MED ORDER — AMOXICILLIN-POT CLAVULANATE 600-42.9 MG/5ML PO SUSR: 600.0000 mg | Freq: Two times a day (BID) | ORAL | Status: AC

## 2013-02-17 MED ORDER — BISACODYL 5 MG PO TBEC: 10.0000 mg | DELAYED_RELEASE_TABLET | Freq: Every day | ORAL | Status: AC | PRN

## 2013-02-17 MED ORDER — ALBUTEROL SULFATE HFA 108 (90 BASE) MCG/ACT IN AERS: 2.0000 | INHALATION_SPRAY | Freq: Four times a day (QID) | RESPIRATORY_TRACT | Status: AC | PRN

## 2013-02-17 NOTE — Progress Notes (Signed)
Pt discharged home with hospice and daughter. Discharge instructions discussed with daughter and scripts given to her as well. Daughter verbalized understanding.

## 2013-02-17 NOTE — Care Management Note (Signed)
Cm spoke with Jeanella Flattery at [facility] to confirm start of care 02/17/13 upon pt's arrival to residence. Cm to fax dc summary and copy of rx to [facility] at [phone]. Cm spoke with Lorin Picket at [facility] to confirm pharmacy has morphine concentrate as prescribed by MD. Lorin Picket confirmed pharmacy has medication stocked. Cm to fax demographics and rx to [facility] at [phone]. Pt' daughter to provide tx home. Portable oxygen tank present at bedside for transport.   Roxy Manns [place],RN,MSN [phone]

## 2013-02-20 ENCOUNTER — Ambulatory Visit: Payer: Medicare Other

## 2013-02-21 ENCOUNTER — Ambulatory Visit: Payer: Medicare Other

## 2013-02-21 ENCOUNTER — Telehealth: Payer: Self-pay | Admitting: Dietician

## 2013-03-08 DEATH — deceased

## 2014-02-04 IMAGING — CR DG CHEST 1V PORT
1 series · 1 of 1 positions shown · non-contrast
Comparison: 02/12/2013

CLINICAL DATA: Right lung mass with postobstructive atelectasis and
pneumonia.

PORTABLE CHEST - 1 VIEW

[AP]
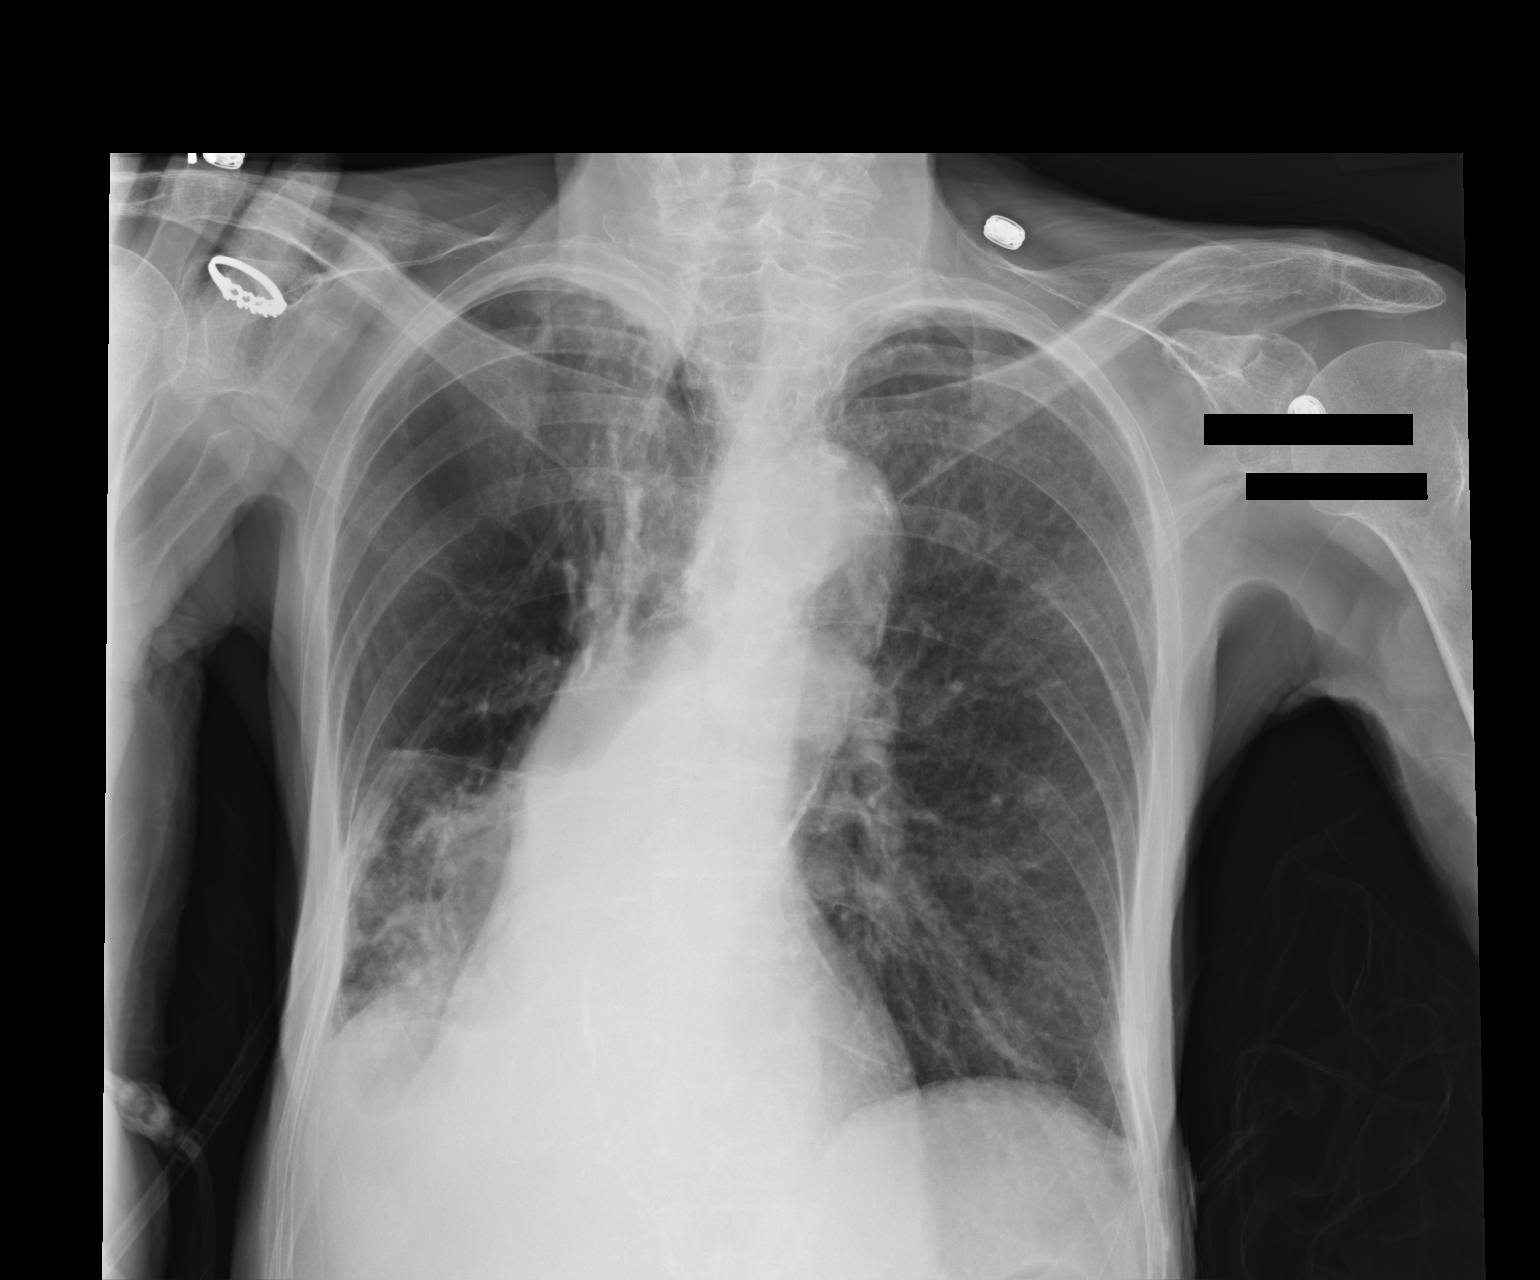

[1 of 1 positions shown; findings below may reference images not displayed]

FINDINGS: Stable appearance of right lower lobe
atelectasis/collapse and underlying COPD.  No edema, pleural fluid
or pneumothorax is identified.  The heart size and mediastinal
contours are stable.
IMPRESSION: Stable right lower lobe atelectasis/collapse.

## 2014-02-06 IMAGING — CR DG CHEST 1V PORT
1 series · 1 of 1 positions shown · non-contrast
Comparison: 02/13/2013 and earlier.

CLINICAL DATA: 71-year-old female chest pain weakness shortness of
Breath. History of lung cancer.

EXAM:
PORTABLE CHEST - 1 VIEW

[AP]
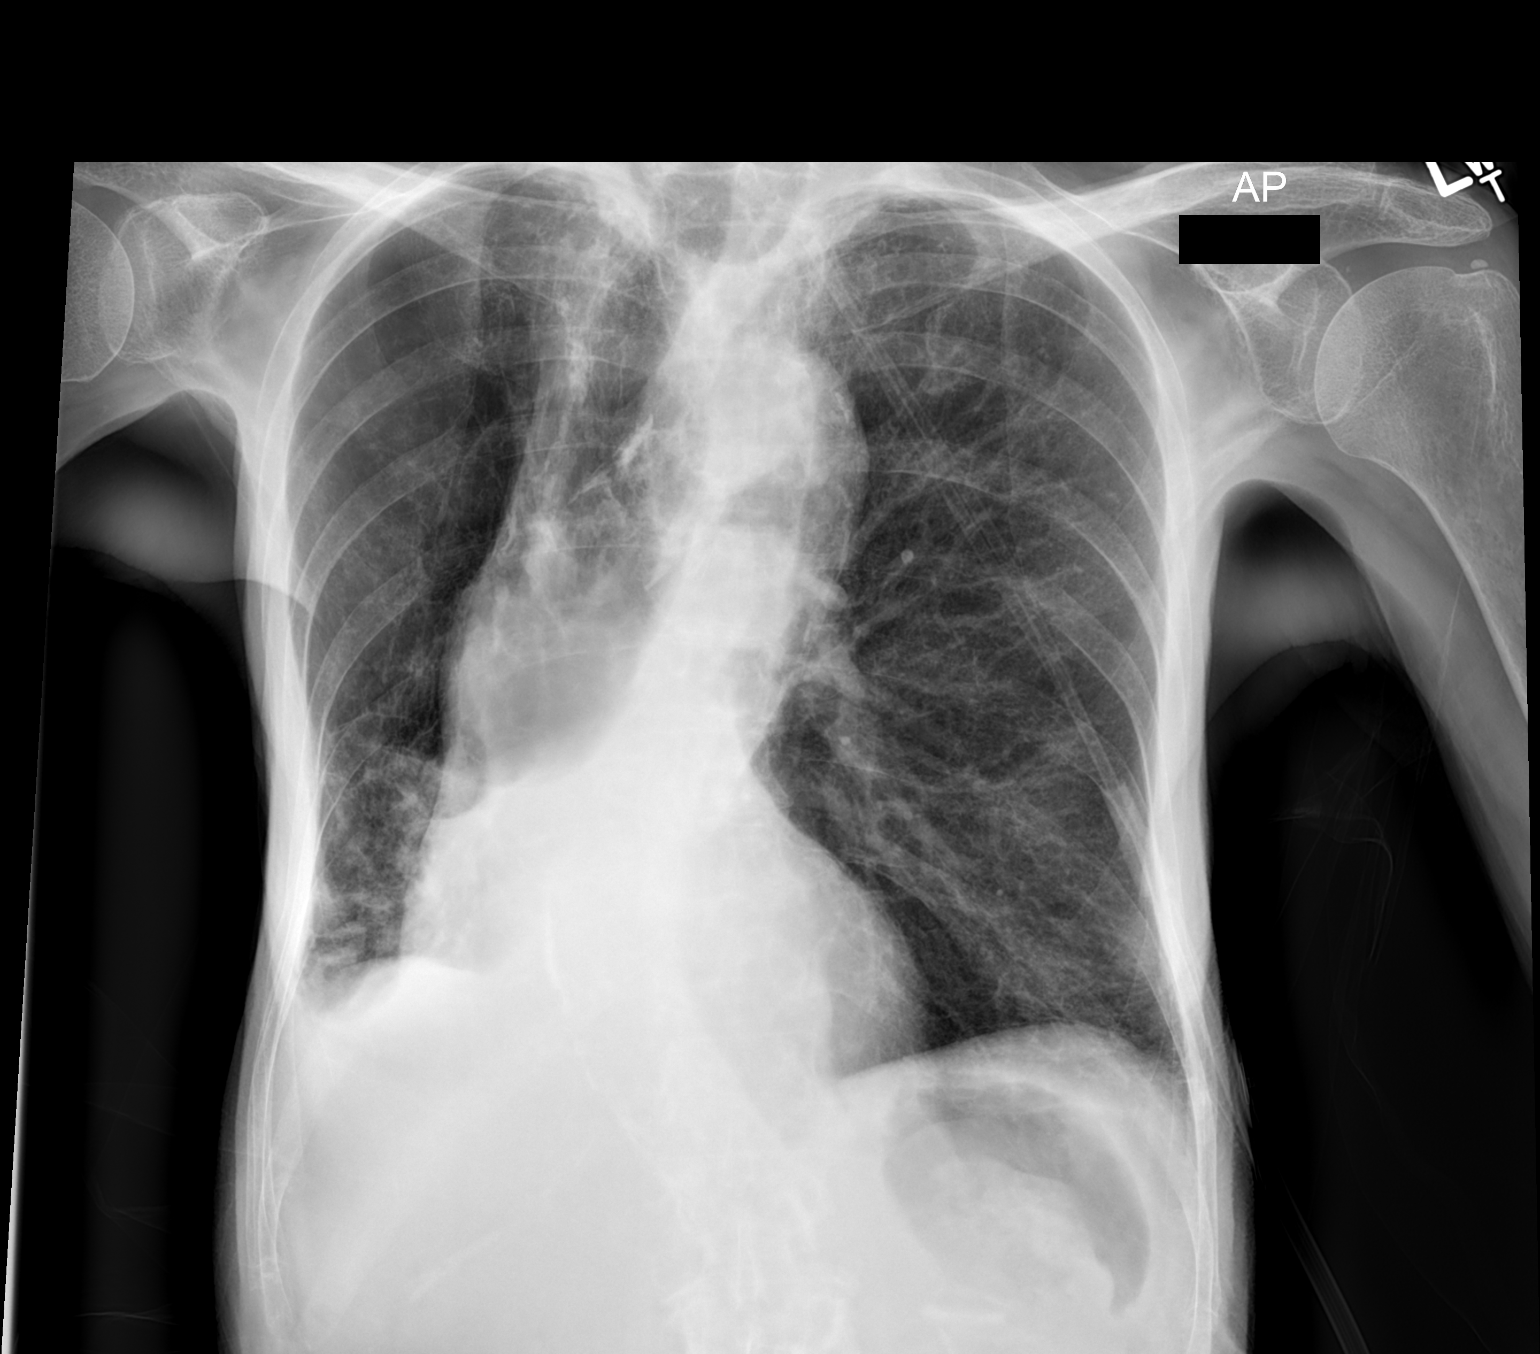

[1 of 1 positions shown; findings below may reference images not displayed]

FINDINGS: Portable AP upright view at 7024 hrs. Interval decreased abnormal
right lung base opacity. Continued decreased volume in the right
hemi thorax. Right lung base pleural mass or effusion again
suspected. The left lung is stable and clear. Stable cardiac size
and mediastinal contours. Scoliosis. Insert bone 's
IMPRESSION: Mildly improved ventilation at the right lung base in the setting of
right lung base cancer. No new cardiopulmonary abnormality.
